# Patient Record
Sex: Male | Born: 1955 | Race: White | Hispanic: Yes | Marital: Married | Smoking: Former smoker
Health system: Southern US, Community
[De-identification: ages and names within clinical notes are randomized; demographics above are authoritative.]

## PROBLEM LIST (undated history)

## (undated) DIAGNOSIS — M65949 Unspecified synovitis and tenosynovitis, unspecified hand: Secondary | ICD-10-CM

## (undated) DIAGNOSIS — M179 Osteoarthritis of knee, unspecified: Secondary | ICD-10-CM

## (undated) DIAGNOSIS — M659 Synovitis and tenosynovitis, unspecified: Secondary | ICD-10-CM

## (undated) DIAGNOSIS — M25819 Other specified joint disorders, unspecified shoulder: Secondary | ICD-10-CM

## (undated) DIAGNOSIS — M25569 Pain in unspecified knee: Secondary | ICD-10-CM

## (undated) DIAGNOSIS — M7540 Impingement syndrome of unspecified shoulder: Secondary | ICD-10-CM

## (undated) DIAGNOSIS — E785 Hyperlipidemia, unspecified: Secondary | ICD-10-CM

## (undated) DIAGNOSIS — L80 Vitiligo: Secondary | ICD-10-CM

## (undated) DIAGNOSIS — M1710 Unilateral primary osteoarthritis, unspecified knee: Secondary | ICD-10-CM

## (undated) DIAGNOSIS — IMO0001 Reserved for inherently not codable concepts without codable children: Secondary | ICD-10-CM

## (undated) HISTORY — DX: Other specified joint disorders, unspecified shoulder: M25.819

## (undated) HISTORY — DX: Pain in unspecified knee: M25.569

## (undated) HISTORY — DX: Synovitis and tenosynovitis, unspecified: M65.9

## (undated) HISTORY — DX: Unilateral primary osteoarthritis, unspecified knee: M17.10

## (undated) HISTORY — DX: Osteoarthritis of knee, unspecified: M17.9

## (undated) HISTORY — DX: Hyperlipidemia, unspecified: E78.5

## (undated) HISTORY — DX: Vitiligo: L80

## (undated) HISTORY — DX: Impingement syndrome of unspecified shoulder: M75.40

## (undated) HISTORY — DX: Reserved for inherently not codable concepts without codable children: IMO0001

## (undated) HISTORY — DX: Unspecified synovitis and tenosynovitis, unspecified hand: M65.949

## (undated) HISTORY — PX: FINGER SURGERY: SHX640

---

## 2007-05-17 ENCOUNTER — Ambulatory Visit: Payer: Self-pay | Admitting: Internal Medicine

## 2007-05-17 LAB — CONVERTED CEMR LAB
ALT: 41 units/L (ref 0–53)
AST: 22 units/L (ref 0–37)
Basophils Absolute: 0 10*3/uL (ref 0.0–0.1)
Basophils Relative: 0 % (ref 0–1)
Bilirubin Urine: NEGATIVE
Calcium: 9.7 mg/dL (ref 8.4–10.5)
Creatinine, Ser: 0.9 mg/dL (ref 0.40–1.50)
Eosinophils Relative: 5 % (ref 0–5)
HCT: 50.6 % (ref 39.0–52.0)
Hemoglobin: 17.4 g/dL — ABNORMAL HIGH (ref 13.0–17.0)
Hgb A1c MFr Bld: 10.5 % — ABNORMAL HIGH (ref 4.6–6.1)
MCHC: 34.4 g/dL (ref 30.0–36.0)
Monocytes Absolute: 0.6 10*3/uL (ref 0.1–1.0)
Neutro Abs: 5.1 10*3/uL (ref 1.7–7.7)
Nitrite: NEGATIVE
Platelets: 169 10*3/uL (ref 150–400)
Protein, U semiquant: NEGATIVE
RDW: 13.1 % (ref 11.5–15.5)
Sodium: 137 meq/L (ref 135–145)
Specific Gravity, Urine: 1.01
Urobilinogen, UA: NEGATIVE
pH: 5

## 2007-05-23 ENCOUNTER — Ambulatory Visit: Payer: Self-pay | Admitting: Internal Medicine

## 2007-05-23 LAB — CONVERTED CEMR LAB: Glucose, Bld: 215 mg/dL

## 2007-06-05 ENCOUNTER — Telehealth: Payer: Self-pay | Admitting: Internal Medicine

## 2007-06-23 ENCOUNTER — Ambulatory Visit: Payer: Self-pay | Admitting: Internal Medicine

## 2007-08-09 ENCOUNTER — Ambulatory Visit: Payer: Self-pay | Admitting: Internal Medicine

## 2007-08-14 LAB — CONVERTED CEMR LAB
GFR calc Af Amer: 154 mL/min
GFR calc non Af Amer: 127 mL/min
Glucose, Bld: 103 mg/dL — ABNORMAL HIGH (ref 70–99)
Hgb A1c MFr Bld: 6.7 % — ABNORMAL HIGH (ref 4.6–6.0)
Potassium: 4 meq/L (ref 3.5–5.1)
Sodium: 141 meq/L (ref 135–145)

## 2007-09-12 ENCOUNTER — Observation Stay (HOSPITAL_COMMUNITY): Admission: EM | Admit: 2007-09-12 | Discharge: 2007-09-13 | Payer: Self-pay | Admitting: Emergency Medicine

## 2007-09-12 ENCOUNTER — Ambulatory Visit: Payer: Self-pay | Admitting: Internal Medicine

## 2007-09-12 ENCOUNTER — Telehealth (INDEPENDENT_AMBULATORY_CARE_PROVIDER_SITE_OTHER): Payer: Self-pay | Admitting: *Deleted

## 2007-09-12 LAB — CONVERTED CEMR LAB: Hemoglobin: 16.9 g/dL

## 2007-09-22 DIAGNOSIS — IMO0001 Reserved for inherently not codable concepts without codable children: Secondary | ICD-10-CM

## 2007-09-22 HISTORY — DX: Reserved for inherently not codable concepts without codable children: IMO0001

## 2007-09-25 ENCOUNTER — Encounter: Payer: Self-pay | Admitting: Internal Medicine

## 2007-09-25 ENCOUNTER — Ambulatory Visit: Payer: Self-pay

## 2007-09-26 ENCOUNTER — Ambulatory Visit: Payer: Self-pay | Admitting: Internal Medicine

## 2007-11-07 ENCOUNTER — Encounter (INDEPENDENT_AMBULATORY_CARE_PROVIDER_SITE_OTHER): Payer: Self-pay | Admitting: *Deleted

## 2007-11-29 ENCOUNTER — Ambulatory Visit: Payer: Self-pay | Admitting: Internal Medicine

## 2007-11-29 DIAGNOSIS — L989 Disorder of the skin and subcutaneous tissue, unspecified: Secondary | ICD-10-CM | POA: Insufficient documentation

## 2007-11-30 ENCOUNTER — Telehealth (INDEPENDENT_AMBULATORY_CARE_PROVIDER_SITE_OTHER): Payer: Self-pay | Admitting: *Deleted

## 2007-12-04 ENCOUNTER — Encounter: Payer: Self-pay | Admitting: Internal Medicine

## 2007-12-04 LAB — CONVERTED CEMR LAB
AST: 32 units/L (ref 0–37)
Cholesterol: 177 mg/dL (ref 0–200)
HDL: 39.3 mg/dL (ref >39.0)
Triglycerides: 76 mg/dL (ref 0–149)
VLDL: 15 mg/dL (ref 0–40)

## 2008-01-23 ENCOUNTER — Ambulatory Visit: Payer: Self-pay | Admitting: Internal Medicine

## 2008-01-23 DIAGNOSIS — F17200 Nicotine dependence, unspecified, uncomplicated: Secondary | ICD-10-CM | POA: Insufficient documentation

## 2008-01-23 DIAGNOSIS — E7800 Pure hypercholesterolemia, unspecified: Secondary | ICD-10-CM | POA: Insufficient documentation

## 2008-01-23 DIAGNOSIS — M25569 Pain in unspecified knee: Secondary | ICD-10-CM | POA: Insufficient documentation

## 2008-01-23 DIAGNOSIS — E785 Hyperlipidemia, unspecified: Secondary | ICD-10-CM

## 2008-01-25 ENCOUNTER — Encounter (INDEPENDENT_AMBULATORY_CARE_PROVIDER_SITE_OTHER): Payer: Self-pay | Admitting: *Deleted

## 2008-02-07 ENCOUNTER — Encounter: Payer: Self-pay | Admitting: Internal Medicine

## 2008-03-05 ENCOUNTER — Telehealth (INDEPENDENT_AMBULATORY_CARE_PROVIDER_SITE_OTHER): Payer: Self-pay | Admitting: *Deleted

## 2008-03-08 ENCOUNTER — Ambulatory Visit: Payer: Self-pay | Admitting: Internal Medicine

## 2008-04-01 ENCOUNTER — Ambulatory Visit: Payer: Self-pay | Admitting: Internal Medicine

## 2008-04-01 DIAGNOSIS — M4800 Spinal stenosis, site unspecified: Secondary | ICD-10-CM

## 2008-04-03 ENCOUNTER — Telehealth (INDEPENDENT_AMBULATORY_CARE_PROVIDER_SITE_OTHER): Payer: Self-pay | Admitting: *Deleted

## 2008-04-03 LAB — CONVERTED CEMR LAB
AST: 34 units/L (ref 0–37)
Albumin: 4.2 g/dL (ref 3.5–5.2)
Alkaline Phosphatase: 82 units/L (ref 39–117)
BUN: 15 mg/dL (ref 6–23)
CO2: 27 meq/L (ref 19–32)
Chloride: 107 meq/L (ref 96–112)
GFR calc non Af Amer: 109 mL/min
Hemoglobin: 16.1 g/dL (ref 13.0–17.0)
Hgb A1c MFr Bld: 6.7 % — ABNORMAL HIGH (ref 4.6–6.0)
Potassium: 4.2 meq/L (ref 3.5–5.1)

## 2008-04-09 ENCOUNTER — Encounter: Payer: Self-pay | Admitting: Internal Medicine

## 2008-04-10 ENCOUNTER — Encounter: Payer: Self-pay | Admitting: Internal Medicine

## 2008-04-26 ENCOUNTER — Ambulatory Visit: Payer: Self-pay | Admitting: Gastroenterology

## 2008-05-28 ENCOUNTER — Telehealth (INDEPENDENT_AMBULATORY_CARE_PROVIDER_SITE_OTHER): Payer: Self-pay | Admitting: *Deleted

## 2008-06-13 ENCOUNTER — Encounter (INDEPENDENT_AMBULATORY_CARE_PROVIDER_SITE_OTHER): Payer: Self-pay | Admitting: *Deleted

## 2008-07-09 ENCOUNTER — Ambulatory Visit: Payer: Self-pay | Admitting: Gastroenterology

## 2008-07-09 LAB — CONVERTED CEMR LAB
Basophils Relative: 0.7 % (ref 0.0–3.0)
Eosinophils Relative: 2.1 % (ref 0.0–5.0)
Ferritin: 192.5 ng/mL (ref 22.0–322.0)
Folate: 14.1 ng/mL
Lymphocytes Relative: 29.3 % (ref 12.0–46.0)
MCV: 87.1 fL (ref 78.0–100.0)
Neutrophils Relative %: 59.7 % (ref 43.0–77.0)
RBC: 5.29 M/uL (ref 4.22–5.81)
Saturation Ratios: 21.2 % (ref 20.0–50.0)
Vitamin B-12: 471 pg/mL (ref 211–911)
WBC: 6.5 10*3/uL (ref 4.5–10.5)

## 2008-07-11 ENCOUNTER — Ambulatory Visit: Payer: Self-pay | Admitting: Internal Medicine

## 2008-07-18 ENCOUNTER — Telehealth (INDEPENDENT_AMBULATORY_CARE_PROVIDER_SITE_OTHER): Payer: Self-pay | Admitting: *Deleted

## 2008-07-22 ENCOUNTER — Ambulatory Visit: Payer: Self-pay | Admitting: Gastroenterology

## 2008-09-25 ENCOUNTER — Ambulatory Visit: Payer: Self-pay | Admitting: Internal Medicine

## 2008-09-30 LAB — CONVERTED CEMR LAB
AST: 30 units/L (ref 0–37)
CO2: 27 meq/L (ref 19–32)
Calcium: 9.2 mg/dL (ref 8.4–10.5)
Creatinine, Ser: 0.7 mg/dL (ref 0.4–1.5)
Glucose, Bld: 132 mg/dL — ABNORMAL HIGH (ref 70–99)
Hgb A1c MFr Bld: 6.8 % — ABNORMAL HIGH (ref 4.6–6.5)
LDL Cholesterol: 65 mg/dL (ref 0–99)
PSA: 0.37 ng/mL (ref 0.10–4.00)
Total CHOL/HDL Ratio: 3
Triglycerides: 109 mg/dL (ref 0.0–149.0)

## 2009-01-29 ENCOUNTER — Telehealth (INDEPENDENT_AMBULATORY_CARE_PROVIDER_SITE_OTHER): Payer: Self-pay | Admitting: *Deleted

## 2009-01-30 ENCOUNTER — Telehealth (INDEPENDENT_AMBULATORY_CARE_PROVIDER_SITE_OTHER): Payer: Self-pay | Admitting: *Deleted

## 2009-02-03 ENCOUNTER — Ambulatory Visit: Payer: Self-pay | Admitting: Internal Medicine

## 2009-02-07 ENCOUNTER — Telehealth (INDEPENDENT_AMBULATORY_CARE_PROVIDER_SITE_OTHER): Payer: Self-pay | Admitting: *Deleted

## 2009-02-12 ENCOUNTER — Encounter (INDEPENDENT_AMBULATORY_CARE_PROVIDER_SITE_OTHER): Payer: Self-pay | Admitting: *Deleted

## 2009-03-26 ENCOUNTER — Telehealth (INDEPENDENT_AMBULATORY_CARE_PROVIDER_SITE_OTHER): Payer: Self-pay | Admitting: *Deleted

## 2009-06-04 ENCOUNTER — Ambulatory Visit: Payer: Self-pay | Admitting: Internal Medicine

## 2009-06-11 ENCOUNTER — Ambulatory Visit: Payer: Self-pay | Admitting: Internal Medicine

## 2009-06-19 ENCOUNTER — Telehealth (INDEPENDENT_AMBULATORY_CARE_PROVIDER_SITE_OTHER): Payer: Self-pay | Admitting: *Deleted

## 2009-06-19 LAB — CONVERTED CEMR LAB
BUN: 15 mg/dL (ref 6–23)
Chloride: 107 meq/L (ref 96–112)
Creatinine, Ser: 0.7 mg/dL (ref 0.4–1.5)
Glucose, Bld: 183 mg/dL — ABNORMAL HIGH (ref 70–99)
Hemoglobin: 14.6 g/dL (ref 13.0–17.0)
Hgb A1c MFr Bld: 8 % — ABNORMAL HIGH (ref 4.6–6.5)

## 2009-08-22 ENCOUNTER — Telehealth (INDEPENDENT_AMBULATORY_CARE_PROVIDER_SITE_OTHER): Payer: Self-pay | Admitting: *Deleted

## 2009-09-02 ENCOUNTER — Ambulatory Visit: Payer: Self-pay | Admitting: Internal Medicine

## 2009-10-31 ENCOUNTER — Encounter: Payer: Self-pay | Admitting: Internal Medicine

## 2009-12-10 ENCOUNTER — Ambulatory Visit: Payer: Self-pay | Admitting: Internal Medicine

## 2009-12-10 DIAGNOSIS — M79609 Pain in unspecified limb: Secondary | ICD-10-CM

## 2009-12-10 LAB — HM DIABETES FOOT EXAM

## 2009-12-11 LAB — CONVERTED CEMR LAB
ALT: 27 units/L (ref 0–53)
AST: 22 units/L (ref 0–37)
BUN: 18 mg/dL (ref 6–23)
Basophils Relative: 0.6 % (ref 0.0–3.0)
Chloride: 105 meq/L (ref 96–112)
Eosinophils Relative: 5 % (ref 0.0–5.0)
HCT: 42.4 % (ref 39.0–52.0)
Hemoglobin: 14.3 g/dL (ref 13.0–17.0)
Hgb A1c MFr Bld: 9 % — ABNORMAL HIGH (ref 4.6–6.5)
LDL Cholesterol: 81 mg/dL (ref 0–99)
Lymphs Abs: 2.1 10*3/uL (ref 0.7–4.0)
MCV: 86 fL (ref 78.0–100.0)
Microalb, Ur: 1.5 mg/dL (ref 0.0–1.9)
Monocytes Absolute: 0.5 10*3/uL (ref 0.1–1.0)
Neutro Abs: 4.2 10*3/uL (ref 1.4–7.7)
PSA: 0.47 ng/mL (ref 0.10–4.00)
Platelets: 170 10*3/uL (ref 150.0–400.0)
Potassium: 3.7 meq/L (ref 3.5–5.1)
RBC: 4.93 M/uL (ref 4.22–5.81)
Sodium: 139 meq/L (ref 135–145)
Total CHOL/HDL Ratio: 3
Total Protein: 6.5 g/dL (ref 6.0–8.3)
VLDL: 10.4 mg/dL (ref 0.0–40.0)
WBC: 7.3 10*3/uL (ref 4.5–10.5)

## 2009-12-23 ENCOUNTER — Encounter: Payer: Self-pay | Admitting: Internal Medicine

## 2010-01-05 ENCOUNTER — Telehealth: Payer: Self-pay | Admitting: Internal Medicine

## 2010-04-20 ENCOUNTER — Ambulatory Visit: Payer: Self-pay | Admitting: Internal Medicine

## 2010-04-24 LAB — CONVERTED CEMR LAB
ALT: 24 units/L (ref 0–53)
Hgb A1c MFr Bld: 7.5 % — ABNORMAL HIGH (ref 4.6–6.5)

## 2010-06-23 NOTE — Progress Notes (Signed)
Summary: diabetes poorly controlled  Phone Note Outgoing Call   Summary of Call: spoke with patient, last hemoglobin A1c was 9. At the time, he was not taking Actos routinely , he was afraid of side effects. At this point, he is taking the medication as prescribed, amb. CBGs have decreased, they are usually 110. He is having some edema at the end of the day but is not severe. We agreed to: Continue present care and take his medication daily He will come back in October for a checkup Jose E. Paz MD  January 05, 2010 8:39 AM

## 2010-06-23 NOTE — Progress Notes (Signed)
  Phone Note Refill Request Message from:  Patient  Refills Requested: Medication #1:  METFORMIN HCL 1000 MG TABS two times a day send to [facility] [phone]  Initial call taken by: Kandice Hams,  August 22, 2009 2:52 PM  Follow-up for Phone Call        left msg for pt rx has been faxed .Kandice Hams  August 22, 2009 2:58 PM  Follow-up by: Kandice Hams,  August 22, 2009 2:58 PM    Prescriptions: METFORMIN HCL 1000 MG TABS (METFORMIN HCL) two times a day  #60 x 1   Entered by:   Kandice Hams   Authorized by:   Nolon Rod. Paz MD   Signed by:   Kandice Hams on 08/22/2009   Method used:   Faxed to ...       [facility] [address] # [phone]* (retail)       [address] [place], [place]  [address]       Ph: [phone]       Fax: [phone]   RxID:   [id number]

## 2010-06-23 NOTE — Assessment & Plan Note (Signed)
Summary: 3 MTH FU/NS/KDC   Vital Signs:  Patient profile:   55 year old male Height:      73.25 inches Weight:      223 pounds Pulse rate:   60 / minute BP sitting:   124 / 76  Vitals Entered By: Shary Decamp (September 02, 2009 3:17 PM) CC: rov, blood sugar @ home avg 120's   History of Present Illness: routine office visit, feels well  Current Medications (verified): 1)  Onetouch Ultra Test  Strp (Glucose Blood) .... Test Blood Sugars Four Times A Day Dx 250.00 2)  Onetouch Lancets  Misc (Lancets) .... Test Blood Sugars 4 Times A Day Dx 250.00 3)  Metformin Hcl 1000 Mg Tabs (Metformin Hcl) .... Two Times A Day 4)  Pravachol 40 Mg Tabs (Pravastatin Sodium) .Marland Kitchen.. 1 By Mouth At Bedtime 5)  Actos 45 Mg Tabs (Pioglitazone Hcl) .Marland Kitchen.. 1 By Mouth Once Daily  Allergies (verified): No Known Drug Allergies  Past History:  Past Medical History: Reviewed history from 06/04/2009 and no changes required. vitiligo Dx in the 90s Diabetes mellitus, type II  Dx 05-17-07 Hyperlipidemia Knee pain, w/u included a MRI of the knee (-) and back, Dx w/ Spinal Stenosis (believed to be causing knee pain) (-) stress test 09-2007 Cscope for hemathochezia 07-2008 no polyps, (+) hemorrhoids, next 10 years   Past Surgical History: Reviewed history from 05/17/2007 and no changes required. finger surgery  Social History: Married 2 chikdren from [place] works at [employer], night shift  Diet-- his diet has improved lately, wife is [place] area exercise-- little d/t leg pain  tobacco--quit tobacco a few weeks ago ETOH-- rarely   Review of Systems       after the last hemoglobin A1c he was Rx  Actos however the patient did not . he knew he was not taking metformin regularly before a day A1c and he decided to take metformin as prescribed he also has noted weight loss lately but denies any fever, cough, nausea vomiting or diarrhea his diet has changed, slightly better. In the last OV  he complained of  fatigue, that has resolved Some anxiety mostly related related to his mother's health  Physical Exam  General:  alert, well-developed, and well-nourished.   Lungs:  normal respiratory effort, no intercostal retractions, no accessory muscle use, and normal breath sounds.   Heart:  normal rate, regular rhythm, and no murmur.   Extremities:  no edema   Impression & Recommendations:  Problem # 1:  DIABETES MELLITUS, TYPE II (ICD-250.00) patient decided not to take Actos as Rx , rather he started to take metformin routinely his CBGs started to drop from the 180s to the  120s he has also noted weight loss, diet apparently slightly better labs The following medications were removed from the medication list:    Actos 45 Mg Tabs (Pioglitazone hcl) .Marland Kitchen... 1 by mouth once daily His updated medication list for this problem includes:    Metformin Hcl 1000 Mg Tabs (Metformin hcl) .Marland Kitchen..Marland Kitchen Two times a day  Orders: Venipuncture (40981) TLB-A1C / Hgb A1C (Glycohemoglobin) (83036-A1C)  Complete Medication List: 1)  Onetouch Ultra Test Strp (Glucose blood) .... Test blood sugars four times a day dx 250.00 2)  Onetouch Lancets Misc (Lancets) .... Test blood sugars 4 times a day dx 250.00 3)  Metformin Hcl 1000 Mg Tabs (Metformin hcl) .... Two times a day 4)  Pravachol 40 Mg Tabs (Pravastatin sodium) .Marland Kitchen.. 1 by mouth at bedtime  Patient  Instructions: 1)  Please schedule a follow-up appointment in 3 months (fasting, physical exam) Prescriptions: PRAVACHOL 40 MG TABS (PRAVASTATIN SODIUM) 1 by mouth at bedtime  #90 x 3   Entered and Authorized by:   Nolon Rod. Charnee Turnipseed MD   Signed by:   Nolon Rod. Dayven Linsley MD on 09/02/2009   Method used:   Print then Give to Patient   RxID:   [id number] METFORMIN HCL 1000 MG TABS (METFORMIN HCL) two times a day  #180 x 3   Entered and Authorized by:   Nolon Rod. Tamilyn Lupien MD   Signed by:   Nolon Rod. Bernese Doffing MD on 09/02/2009   Method used:   Print then Give to Patient   RxID:    [id number]

## 2010-06-23 NOTE — Assessment & Plan Note (Signed)
Summary: out of metformin,also followup visit cancelled from before/kdc   Vital Signs:  Patient profile:   55 year old male Height:      73.25 inches Weight:      229.8 pounds BMI:     30.22 Pulse rate:   68 / minute BP sitting:   120 / 80  Vitals Entered By: Shary Decamp (June 04, 2009 3:59 PM) CC: rov - out of meds x 5 days   History of Present Illness: DM base on the last hemoglobin A1c, his metformin was increased.  Good compliance with medication except for the last 5 days when he ran out of metformin His CBGs are checked infrequently, yesterday it was 150 he is due for an eye exam on  06-2009  high chol--  good medication compliance  fatigue   for the last two months he has noted  lack of energy admits to mild  depressive mood , no anxiety.  Some financial issues can't explain his fatigue any better, see review of systems  Current Medications (verified): 1)  Onetouch Ultra Test  Strp (Glucose Blood) .... Test Blood Sugars Four Times A Day Dx 250.00 2)  Onetouch Lancets  Misc (Lancets) .... Test Blood Sugars 4 Times A Day Dx 250.00 3)  Metformin Hcl 1000 Mg Tabs (Metformin Hcl) .... Two Times A Day 4)  Pravachol 40 Mg Tabs (Pravastatin Sodium) .Marland Kitchen.. 1 By Mouth At Bedtime  Allergies (verified): No Known Drug Allergies  Past History:  Past Medical History: vitiligo Dx in the 90s Diabetes mellitus, type II  Dx 05-17-07 Hyperlipidemia Knee pain, w/u included a MRI of the knee (-) and back, Dx w/ Spinal Stenosis (believed to be causing knee pain) (-) stress test 09-2007 Cscope for hemathochezia 07-2008 no polyps, (+) hemorrhoids, next 10 years   Past Surgical History: Reviewed history from 05/17/2007 and no changes required. finger surgery  Social History: Reviewed history from 02/03/2009 and no changes required. Married 2 chikdren from [place] works at [employer], night shift  Diet-- trying to eat healthy exercise-- little d/t leg pain  tobacco-- casual  smoker  ETOH-- rarely   Review of Systems       denies fevers no chest pain, shortness or breath or orthopnea denies nausea vomiting or diarrhea his diet continued to be okay denies any lower extremity paresthesias sleep sometimes interrupted as he continued to have right knee discomfort.  Physical Exam  General:  alert, well-developed, and well-nourished.   Lungs:  normal respiratory effort, no intercostal retractions, no accessory muscle use, and normal breath sounds.   Heart:  normal rate, regular rhythm, no murmur, and no gallop.   Abdomen:  soft, non-tender, no distention, no masses, and no hepatomegaly.   Extremities:  no edema Psych:  not anxious appearing and not depressed appearing.     Impression & Recommendations:  Problem # 1:  HYPERLIPIDEMIA (ICD-272.4) at goal per last labs His updated medication list for this problem includes:    Pravachol 40 Mg Tabs (Pravastatin sodium) .Marland Kitchen... 1 by mouth at bedtime  Labs Reviewed: SGOT: 30 (09/25/2008)   SGPT: 31 (09/25/2008)   HDL:36.10 (09/25/2008), 39.3 (11/29/2007)  LDL:65 (09/25/2008), 123 (16/02/9603)  Chol:123 (09/25/2008), 177 (11/29/2007)  Trig:109.0 (09/25/2008), 76 (11/29/2007)  Problem # 2:  DIABETES MELLITUS, TYPE II (ICD-250.00) good compliance with medication except for the last few days RF labs adjust medication if needed His updated medication list for this problem includes:    Metformin Hcl 1000 Mg Tabs (Metformin hcl) .Marland KitchenMarland KitchenMarland KitchenMarland Kitchen  Two times a day  Labs Reviewed: Creat: 0.7 (09/25/2008)    Reviewed HgBA1c results: 7.9 (02/03/2009)  6.8 (09/25/2008)  Problem # 3:  FATIGUE (ICD-780.79) Assessment: New lack of energy for two months, mild depression? Labs observe further evaluation if symptoms progress  Complete Medication List: 1)  Onetouch Ultra Test Strp (Glucose blood) .... Test blood sugars four times a day dx 250.00 2)  Onetouch Lancets Misc (Lancets) .... Test blood sugars 4 times a day dx 250.00 3)   Metformin Hcl 1000 Mg Tabs (Metformin hcl) .... Two times a day 4)  Pravachol 40 Mg Tabs (Pravastatin sodium) .Marland Kitchen.. 1 by mouth at bedtime  Patient Instructions: 1)  please come back, nonfasting, for the following labs: 2)   A1c, BMP --- DX diabetes 3)  AST, ALT----Dx high cholesterol 4)  TSH, hemoglobin--- Dx  fatigue 5)  Please schedule a follow-up appointment in 3 months .  Prescriptions: PRAVACHOL 40 MG TABS (PRAVASTATIN SODIUM) 1 by mouth at bedtime  #90 x 1   Entered and Authorized by:   Elita Quick E. Melenie Minniear MD   Signed by:   Nolon Rod. Numair Masden MD on 06/04/2009   Method used:   Electronically to        [facility] # [phone]* (retail)       [address] [place], [place]  [address]       Ph: [phone]       Fax: [phone]   RxID:   [id number] METFORMIN HCL 1000 MG TABS (METFORMIN HCL) two times a day  #180 x 1   Entered and Authorized by:   Nolon Rod. Sussie Minor MD   Signed by:   Nolon Rod. Venezia Sargeant MD on 06/04/2009   Method used:   Electronically to        [facility] # [phone]* (retail)       [address] [place], [place]  [address]       Ph: [phone]       Fax: [phone]   RxID:   [id number] PRAVACHOL 40 MG TABS (PRAVASTATIN SODIUM) 1 by mouth at bedtime  #30 x 6   Entered by:   Shary Decamp   Authorized by:   Nolon Rod. Nirav Sweda MD   Signed by:   Shary Decamp on 06/04/2009   Method used:   Electronically to        [facility] # [phone]* (retail)       [address] [place], [place]  [address]       Ph: [phone]       Fax: [phone]   RxID:   [id number] METFORMIN HCL 1000 MG TABS (METFORMIN HCL) two times a day  #60 x 6   Entered by:   Shary Decamp   Authorized by:   Nolon Rod. Jensen Cheramie MD   Signed by:   Shary Decamp on 06/04/2009   Method used:   Electronically to        [facility] # [phone]* (retail)       [address]       [address]       [place], [place]   [address]  Ph: [phone]       Fax: [phone]   RxID:   [id number]

## 2010-06-23 NOTE — Letter (Signed)
Summary: negative diabetic eye exam  [facility]   Imported By: Lanelle Bal 01/01/2010 11:21:15  _____________________________________________________________________  External Attachment:    Type:   Image     Comment:   External Document

## 2010-06-23 NOTE — Progress Notes (Signed)
Summary: actos rx  Phone Note Outgoing Call   Summary of Call: dm  not well controlled, add Actos 45 mg daily--call #30 and 3 RF, samples ok LMOM f/u as planned Jose E. Paz MD  June 19, 2009 12:01 PM      New/Updated Medications: ACTOS 45 MG TABS (PIOGLITAZONE HCL) 1 by mouth once daily Prescriptions: ACTOS 45 MG TABS (PIOGLITAZONE HCL) 1 by mouth once daily  #3 x 3   Entered by:   Shary Decamp   Authorized by:   Nolon Rod. Paz MD   Signed by:   Shary Decamp on 06/19/2009   Method used:   Electronically to        [facility] # [phone]* (retail)       [address] [place], [place]  [address]       Ph: [phone]       Fax: [phone]   RxID:   [id number]   Appended Document: actos rx LMOM again , checking to be sure he got the previous message

## 2010-06-23 NOTE — Letter (Signed)
Summary: Letter Regarding Lipid Panel & Microalbumin Screening/[employer]  Letter Regarding Lipid Panel & Microalbumin Screening/[employer]   Imported By: Lanelle Bal 11/11/2009 09:17:35  _____________________________________________________________________  External Attachment:    Type:   Image     Comment:   External Document

## 2010-06-23 NOTE — Assessment & Plan Note (Signed)
Summary: rto 4 months/cbs   Vital Signs:  Patient profile:   55 year old male Height:      74 inches Weight:      231.25 pounds BMI:     29.80 Pulse rate:   64 / minute Pulse rhythm:   regular BP sitting:   126 / 82  (left arm) Cuff size:   large  Vitals Entered By: Army Fossa CMA (April 20, 2010 3:44 PM) CC: 4 month f/u- fasting  Comments flu shot  [facility] [address]    History of Present Illness: routine office visit Good compliance with Actos ----> ambulatory CBGs between 99 and 130 the last time he complained of numbness in the toes, that  self resolve without even taking Neurontin continued to have severe, bilateral, arm pain. Pain is sharp, short duration, triggered by certain positions.  Review of systems denies chest pain, shortness of breath or edema    Current Medications (verified): 1)  Onetouch Ultra Test  Strp (Glucose Blood) .... Test Blood Sugars Four Times A Day Dx 250.00 2)  Onetouch Lancets  Misc (Lancets) .... Test Blood Sugars 4 Times A Day Dx 250.00 3)  Metformin Hcl 1000 Mg Tabs (Metformin Hcl) .... Two Times A Day 4)  Pravachol 40 Mg Tabs (Pravastatin Sodium) .Marland Kitchen.. 1 By Mouth At Bedtime 5)  Actos 30 Mg Tabs (Pioglitazone Hcl) .Marland Kitchen.. 1 By Mouth Once Daily 6)  Gabapentin 300 Mg Caps (Gabapentin) .Marland Kitchen.. 1 At Bedtime  Allergies (verified): No Known Drug Allergies  Past History:  Past Medical History: Reviewed history from 06/04/2009 and no changes required. vitiligo Dx in the 90s Diabetes mellitus, type II  Dx 05-17-07 Hyperlipidemia Knee pain, w/u included a MRI of the knee (-) and back, Dx w/ Spinal Stenosis (believed to be causing knee pain) (-) stress test 09-2007 Cscope for hemathochezia 07-2008 no polyps, (+) hemorrhoids, next 10 years   Past Surgical History: Reviewed history from 05/17/2007 and no changes required. finger surgery  Physical Exam  General:  alert, well-developed, and well-nourished.   Lungs:  normal respiratory  effort, no intercostal retractions, no accessory muscle use, and normal breath sounds.   Heart:  normal rate, regular rhythm, and no murmur.   Msk:  nontender to palpation at the bicipital,tricipital or quadriceps area Extremities:  no lower extremity edema   Impression & Recommendations:  Problem # 1:  ARM PAIN (ICD-729.5) related to cholesterol medication? see Instructions Orders: TLB-CK Total Only(Creatine Kinase/CPK) (82550-CK) Specimen Handling ([address])  Problem # 2:  HYPERLIPIDEMIA (ICD-272.4) Will hold Pravachol for a month. See #1 His updated medication list for this problem includes:    Pravachol 40 Mg Tabs (Pravastatin sodium) .Marland Kitchen... 1 by mouth at bedtime  Labs Reviewed: SGOT: 22 (12/10/2009)   SGPT: 27 (12/10/2009)   HDL:48.60 (12/10/2009), 36.10 (09/25/2008)  LDL:81 (12/10/2009), 65 (09/25/2008)  Chol:140 (12/10/2009), 123 (09/25/2008)  Trig:52.0 (12/10/2009), 109.0 (09/25/2008)  Problem # 3:  DIABETES MELLITUS, TYPE II (ICD-250.00) good compliance with medication ;  labs he complained of toe burning at the last office visit, symptoms self resolved without medication His updated medication list for this problem includes:    Metformin Hcl 1000 Mg Tabs (Metformin hcl) .Marland Kitchen..Marland Kitchen Two times a day    Actos 30 Mg Tabs (Pioglitazone hcl) .Marland Kitchen... 1 by mouth once daily  Orders: Venipuncture ([address]) TLB-A1C / Hgb A1C (Glycohemoglobin) (83036-A1C) TLB-ALT (SGPT) (84460-ALT) TLB-AST (SGOT) (84450-SGOT) Specimen Handling (09811)  Labs Reviewed: Creat: 0.7 (12/10/2009)    Reviewed HgBA1c results: 9.0 (12/10/2009)  7.4 (09/02/2009)  Complete Medication List: 1)  Onetouch Ultra Test Strp (Glucose blood) .... Test blood sugars four times a day dx 250.00 2)  Onetouch Lancets Misc (Lancets) .... Test blood sugars 4 times a day dx 250.00 3)  Metformin Hcl 1000 Mg Tabs (Metformin hcl) .... Two times a day 4)  Pravachol 40 Mg Tabs (Pravastatin sodium) .Marland Kitchen.. 1 by mouth at bedtime 5)  Actos  30 Mg Tabs (Pioglitazone hcl) .Marland Kitchen.. 1 by mouth once daily  Other Orders: Admin 1st Vaccine ([address]) Flu Vaccine 34yrs + ([address])  Patient Instructions: 1)  NO pravastatin x 1 month, then restart 2)  call and let me know if the pain is or is not related to pravastatin 3)  Please schedule a follow-up appointment in 3 months .  Flu Vaccine Consent Questions     Do you have a history of severe allergic reactions to this vaccine? no    Any prior history of allergic reactions to egg and/or gelatin? no    Do you have a sensitivity to the preservative Thimersol? no    Do you have a past history of Guillan-Barre Syndrome? no    Do you currently have an acute febrile illness? no    Have you ever had a severe reaction to latex? no    Vaccine information given and explained to patient? yes    Are you currently pregnant? no    Lot Number:[id number]   Exp Date:11/21/2010   Site Given  right Deltoid IM  Orders Added: 1)  Admin 1st Vaccine [90471] 2)  Flu Vaccine 21yrs + [90658] 3)  Venipuncture [36415] 4)  TLB-A1C / Hgb A1C (Glycohemoglobin) [83036-A1C] 5)  TLB-ALT (SGPT) [84460-ALT] 6)  TLB-AST (SGOT) [84450-SGOT] 7)  TLB-CK Total Only(Creatine Kinase/CPK) [82550-CK] 8)  Specimen Handling [99000] 9)  Est. Patient Level III [09811]   .[email or web address]

## 2010-06-23 NOTE — Assessment & Plan Note (Signed)
Summary: cpx   Vital Signs:  Patient profile:   55 year old male Height:      74 inches Weight:      227.13 pounds Pulse rate:   58 / minute Pulse rhythm:   re1 BP sitting:   132 / 80  (left arm) Cuff size:   large  Vitals Entered By: Army Fossa CMA (December 10, 2009 8:34 AM) CC: Pt here for CPX: Fasting.   History of Present Illness: CPX -also complaining of burning at the tip of his toes bilaterally, worse at bedtime -also complained of right more than left arm pain. The pain is stabbing, acute, sharp and triggered  by certain movements. No neck pain  Preventive Screening-Counseling & Management  Caffeine-Diet-Exercise     Does Patient Exercise: no  Current Medications (verified): 1)  Onetouch Ultra Test  Strp (Glucose Blood) .... Test Blood Sugars Four Times A Day Dx 250.00 2)  Onetouch Lancets  Misc (Lancets) .... Test Blood Sugars 4 Times A Day Dx 250.00 3)  Metformin Hcl 1000 Mg Tabs (Metformin Hcl) .... Two Times A Day 4)  Pravachol 40 Mg Tabs (Pravastatin Sodium) .Marland Kitchen.. 1 By Mouth At Bedtime 5)  Actos 30 Mg Tabs (Pioglitazone Hcl) .Marland Kitchen.. 1 By Mouth Once Daily  Allergies (verified): No Known Drug Allergies  Past History:  Past Medical History: Reviewed history from 06/04/2009 and no changes required. vitiligo Dx in the 90s Diabetes mellitus, type II  Dx 05-17-07 Hyperlipidemia Knee pain, w/u included a MRI of the knee (-) and back, Dx w/ Spinal Stenosis (believed to be causing knee pain) (-) stress test 09-2007 Cscope for hemathochezia 07-2008 no polyps, (+) hemorrhoids, next 10 years   Past Surgical History: Reviewed history from 05/17/2007 and no changes required. finger surgery  Family History: Reviewed history from 09/12/2007 and no changes required. CAD - F  in his mid 61s HTN - M STROKE - M DM-F,M COLON CA - NO PROSTATE CA - NO DM-- F and M  Social History: Married 2 chikdren from [place] works at [employer], night shift  Diet-- his diet is ok  , wife is vegetarian exercise-- active at work tobacco-- rarely smokes  ETOH-- rarely Does Patient Exercise:  no  Review of Systems CV:  Denies chest pain or discomfort and swelling of feet. Resp:  Denies cough, shortness of breath, and wheezing. GI:  Denies bloody stools, diarrhea, nausea, and vomiting. GU:  Denies dysuria, hematuria, urinary frequency, and urinary hesitancy.  Physical Exam  General:  alert, well-developed, and well-nourished.   Neck:  supple, full ROM, no masses, no thyromegaly, and normal carotid upstroke.   cervical spine nontender to palpation Lungs:  normal respiratory effort, no intercostal retractions, no accessory muscle use, and normal breath sounds.   Heart:  normal rate, regular rhythm, and no murmur.   Abdomen:  soft, non-tender, no distention, no masses, no guarding, and no rigidity.   Rectal:  No external abnormalities noted. Normal sphincter tone. No rectal masses or tenderness. Prostate:  Prostate gland firm and smooth, no enlargement, nodularity, tenderness, mass, asymmetry or induration. Pulses:  normal pedal pulses bilaterally Extremities:  no lower extremity edema Neurologic:  alert & oriented X3, strength normal in all extremities, and DTRs symmetrical and normal.   Psych:  Cognition and judgment appear intact. Alert and cooperative with normal attention span and concentration.   Diabetes Management Exam:    Foot Exam (with socks and/or shoes not present):       Sensory-Pinprick/Light  touch:          Left medial foot (L-4): normal          Left dorsal foot (L-5): normal          Left lateral foot (S-1): normal          Right medial foot (L-4): normal          Right dorsal foot (L-5): normal          Right lateral foot (S-1): normal       Sensory-Monofilament:          Left foot: diminished          Right foot: diminished       Sensory-other: sensory monofilament decreased at the tip of the third and fourth right toes and fourth left toe        Inspection:          Left foot: normal          Right foot: normal       Nails:          Left foot: normal          Right foot: normal   Impression & Recommendations:  Problem # 1:  HEALTH SCREENING (ICD-V70.0) Td 10-07 Cscope for hemathochezia 07-2008 no polyps, (+) hemorrhoids, next 10 years   he used to smoke tobacco or q. day, now smokes rarely. Encouraged to quit Diet and exercise discussed Labs  Orders: TLB-BMP (Basic Metabolic Panel-BMET) (80048-METABOL) TLB-CBC Platelet - w/Differential (85025-CBCD) TLB-PSA (Prostate Specific Antigen) (84153-PSA) TLB-Hepatic/Liver Function Pnl (80076-HEPATIC) Venipuncture (87564) Specimen Handling (33295)  Problem # 2:  DIABETES MELLITUS, TYPE II (ICD-250.00) note well controlled per last hemoglobin A1c, Actos was added He also has peripheral neuropathy  ( painful and sensory loss, see above)-----------trial w/ gabapentin Feet care discussed encourage eye examination yearly His updated medication list for this problem includes:    Metformin Hcl 1000 Mg Tabs (Metformin hcl) .Marland Kitchen..Marland Kitchen Two times a day    Actos 30 Mg Tabs (Pioglitazone hcl) .Marland Kitchen... 1 by mouth once daily  Labs Reviewed: Creat: 0.7 (06/11/2009)    Reviewed HgBA1c results: 7.4 (09/02/2009)  8.0 (06/11/2009)  Orders: TLB-A1C / Hgb A1C (Glycohemoglobin) (83036-A1C) TLB-Microalbumin/Creat Ratio, Urine (82043-MALB)  Problem # 3:  ARM PAIN (ICD-729.5) see description of symptoms at the history of present illness Neurological exam normal Etiology unclear to see orthopedic surgery if needed  Complete Medication List: 1)  Onetouch Ultra Test Strp (Glucose blood) .... Test blood sugars four times a day dx 250.00 2)  Onetouch Lancets Misc (Lancets) .... Test blood sugars 4 times a day dx 250.00 3)  Metformin Hcl 1000 Mg Tabs (Metformin hcl) .... Two times a day 4)  Pravachol 40 Mg Tabs (Pravastatin sodium) .Marland Kitchen.. 1 by mouth at bedtime 5)  Actos 30 Mg Tabs (Pioglitazone hcl)  .Marland Kitchen.. 1 by mouth once daily 6)  Gabapentin 300 Mg Caps (Gabapentin) .Marland Kitchen.. 1 at bedtime  Other Orders: TLB-Lipid Panel (80061-LIPID)  Patient Instructions: 1)  Please schedule a follow-up appointment in 4 months .  Prescriptions: GABAPENTIN 300 MG CAPS (GABAPENTIN) 1 at bedtime  #30 x 6   Entered and Authorized by:   Nolon Rod. Talin Rozeboom MD   Signed by:   Nolon Rod. Nance Mccombs MD on 12/10/2009   Method used:   Print then Give to Patient   RxID:   [id number]    Risk Factors:  Exercise:  no

## 2010-06-23 NOTE — Progress Notes (Signed)
  Phone Note From Pharmacy   Caller: [facility] # [phone]* Reason for Call: Cannot read prescription Summary of Call: pharmacist called in ref to rx  Actos that was sent in # 3? did you mean 30.  Called and spoke with pharmacist quantity should be 30.  rx REFAXED Initial call taken by: Kandice Hams,  June 19, 2009 3:37 PM    Prescriptions: ACTOS 45 MG TABS (PIOGLITAZONE HCL) 1 by mouth once daily  #30 x 3   Entered by:   Kandice Hams   Authorized by:   Nolon Rod. Paz MD   Signed by:   Kandice Hams on 06/19/2009   Method used:   Re-Faxed to ...       [facility] [address] [address] # [phone]* (retail)       [address] [place], [place]  [address]       Ph: [phone]       Fax: [phone]   RxID:   [id number]

## 2010-08-17 ENCOUNTER — Telehealth: Payer: Self-pay | Admitting: *Deleted

## 2010-08-17 DIAGNOSIS — M25519 Pain in unspecified shoulder: Secondary | ICD-10-CM

## 2010-08-18 NOTE — Telephone Encounter (Signed)
Arrange a ortho referal, dx shoulder pain Be sure he is back on pravachol Also he is due for a OV

## 2010-08-18 NOTE — Telephone Encounter (Signed)
I spoke w/ pts wife she is aware. He will restart Pravachol.

## 2010-09-03 LAB — GLUCOSE, CAPILLARY: Glucose-Capillary: 90 mg/dL (ref 70–99)

## 2010-09-04 ENCOUNTER — Telehealth: Payer: Self-pay | Admitting: Internal Medicine

## 2010-09-04 MED ORDER — GLUCOSE BLOOD VI STRP: ORAL_STRIP | Status: AC

## 2010-09-04 NOTE — Telephone Encounter (Signed)
Pts wife is here requesting a refill for patients test strips. She wants to have this filled now since she says she called yesterday and hasn't heard back.

## 2010-09-18 ENCOUNTER — Ambulatory Visit: Payer: Self-pay | Admitting: Internal Medicine

## 2010-09-22 HISTORY — PX: SHOULDER SURGERY: SHX246

## 2010-09-30 ENCOUNTER — Ambulatory Visit (INDEPENDENT_AMBULATORY_CARE_PROVIDER_SITE_OTHER): Payer: Managed Care, Other (non HMO) | Admitting: Internal Medicine

## 2010-09-30 ENCOUNTER — Encounter: Payer: Self-pay | Admitting: Internal Medicine

## 2010-09-30 DIAGNOSIS — E785 Hyperlipidemia, unspecified: Secondary | ICD-10-CM

## 2010-09-30 DIAGNOSIS — E119 Type 2 diabetes mellitus without complications: Secondary | ICD-10-CM

## 2010-09-30 MED ORDER — METFORMIN HCL 1000 MG PO TABS: ORAL_TABLET | ORAL | Status: DC

## 2010-09-30 NOTE — Progress Notes (Signed)
  Subjective:    Patient ID: Daniel [name], male    DOB: Jan 24, 1956, 55 y.o.   MRN: [id number]  HPI Routine office visit Still having problems with shoulder pain, status post a local injection which helped a little. He had an MRI and has a followup with orthopedic surgery soon.  Past Medical History  Diagnosis Date  . Vitiligo     dx in the 90s  . Diabetes mellitus 05/17/07  . Hyperlipidemia   . Knee pain     w/u included a MRI of the knee (-) and back, dx w/ Spinal Stenosis (believed to be causing knee pain)  . Normal cardiac stress test 09/2007   Past Surgical History  Procedure Date  . Finger surgery      Review of Systems Diet is okay, blood sugar is around 130 in the morning. They were slightly high  immediately after the shoulder injection. Denies any chest pain or shortness of breath. Has his eyes checked at least once a year. No extremity paresthesias    Objective:   Physical Exam Alert, oriented x3. Lungs clear to auscultation bilaterally. Cardiovascular: Regular weight and rhythm without murmur. Extremities no edema. Neurological exam, speech, gait and motor normal        Assessment & Plan:

## 2010-09-30 NOTE — Assessment & Plan Note (Signed)
Last hemoglobin A1c 7.5, encourage diet, works long hours and he is unable to exercise after work.

## 2010-09-30 NOTE — Assessment & Plan Note (Signed)
Well controlled per last labs  

## 2010-10-01 ENCOUNTER — Other Ambulatory Visit (INDEPENDENT_AMBULATORY_CARE_PROVIDER_SITE_OTHER): Payer: Managed Care, Other (non HMO)

## 2010-10-01 DIAGNOSIS — E119 Type 2 diabetes mellitus without complications: Secondary | ICD-10-CM

## 2010-10-02 LAB — BASIC METABOLIC PANEL
CO2: 28 mEq/L (ref 19–32)
Calcium: 9.5 mg/dL (ref 8.4–10.5)
GFR: 112.6 mL/min (ref >60.00)
Glucose, Bld: 288 mg/dL — ABNORMAL HIGH (ref 70–99)
Potassium: 4.4 mEq/L (ref 3.5–5.1)
Sodium: 139 mEq/L (ref 135–145)

## 2010-10-06 NOTE — Discharge Summary (Signed)
NAME:  Daniel [name], Daniel [name] NO.:  [id number]   MEDICAL RECORD NO.:  [id number]          PATIENT TYPE:  INP   LOCATION:  5502                         FACILITY:  [facility]   PHYSICIAN:  Valerie A. Felicity Coyer, MDDATE OF BIRTH:  Oct 07, 1955   DATE OF ADMISSION:  09/12/2007  DATE OF DISCHARGE:  09/13/2007                               DISCHARGE SUMMARY   DISCHARGE DIAGNOSES:  1. Substernal chest pain.  2. Type 2 diabetes.   HISTORY OF PRESENT ILLNESS:  Mr. Finch is a 55 year old Hispanic male  with history of type 2 diabetes who presented to his primary care  physician on day of admission with reports of epigastric pain, on  evening prior to visit.  The patient works night shifts and reports  onset of intense epigastric pain at 4 a.m. after eating doughnut and  drinking water.  The patient also reported subsequent nausea and unsure  of any shortness of the breath.  Described pain as sharp, denied any  radiation or vomiting.  Due to the patient's risk factors including  diabetes, occasional tobacco use, and family history of CAD in his  father, the patient was admitted to the hospital at that time for  further evaluation.   PAST MEDICAL HISTORY:  1. Vitiligo.  2. Type 2 diabetes.  3. History of finger surgery.   COURSE OF HOSPITALIZATION:  1. Substernal chest pain.  The patient admitted, started on 324 mg of      aspirin.  Cardiac enzymes were obtained which were negative x3.      Chest x-ray was negative for any acute findings.  The patient's      symptoms are suspected to be GI related.  However, due to the      patient's risk factors mentioned above, the patient to be scheduled      for outpatient Myoview on Sep 25, 2007, at 9 a.m. to be followed up      by his primary care physician.  The patient instructed to start on      daily aspirin in addition to over-the-counter ranitidine to see if      this helps relieve symptoms.   MEDICATIONS AT THE TIME OF DISCHARGE:  1.  Metformin 500 mg p.o. b.i.d.  2. Aspirin 325 mg p.o. daily.  3. Ranitidine 150 mg p.o. daily.   LABORATORY AT THE TIME OF DISCHARGE:  White cell count 5.2. platelets  147, hemoglobin 14.3, hematocrit 42.4.  Sodium 136, potassium 3.6, BUN  16, creatinine 0.61, INR 1.0.  Again cardiac enzymes were negative x3.  Chest x-ray with no active disease.   DISPOSITION:  The patient felt medically stable for discharge to home at  this time.  He has had no recurrent chest pain since his initial episode  while at work.  The patient again is scheduled for outpatient Myoview on  Sep 25, 2007 at 9 a.m.  In addition, the patient is instructed to follow  with his primary care physician Dr. Willow Ora on Sep 26, 2007 at 3:45 p.m.  at which time Cardiolite results can be reviewed.  Cordelia Pen, NP      Raenette Rover. Felicity Coyer, MD  Electronically Signed    LE/MEDQ  D:  09/13/2007  T:  09/13/2007  Job:  [id number]   cc:   Willow Ora, MD

## 2010-10-06 NOTE — Discharge Summary (Signed)
NAME:  Daniel [name], Daniel [name] NO.:  [id number]   MEDICAL RECORD NO.:  [id number]          PATIENT TYPE:  OBV   LOCATION:  5502                         FACILITY:  [facility]   PHYSICIAN:  Valerie A. Felicity Coyer, MDDATE OF BIRTH:  [birth year]   DATE OF ADMISSION:  09/12/2007  DATE OF DISCHARGE:  09/13/2007                               DISCHARGE SUMMARY   DISCHARGE DIAGNOSES:  1. Substernal chest pain.  2. Type 2 diabetes.  3. Tobacco abuse.   HISTORY OF PRESENT ILLNESS:  Daniel [name] is a 55 year old Hispanic male  with past medical history of type 2 diabetes who presented to the office  on September 12, 2007, with reports of sudden onset of intense epigastric  pain while at work.  At the time of visit, the patient reported symptoms  had decreased gradually.  He denied any radiation of pain, but did  report some anterior chest pressure.  The patient states pain began  shortly after p.o. intake and with subsequent nausea, denied any  vomiting, unable to tell if any shortness of breath.  Due to the  patient's history of type 2 diabetes, family history of CAD, and current  tobacco use; the patient was admitted to the hospital for further  evaluation and treatment.   PAST MEDICAL HISTORY:  1. Vitiligo.  2. Type 2 diabetes.  3. Finger surgery.   COURSE OF HOSPITALIZATION:  As mentioned above, the patient with  positive risk factors for CAD including family history in father.  Also,  the patient with current tobacco abuse and obesity as well as type 2  diabetes.  Cardiac enzymes were cycled x3 which were negative for any  acute findings.  EKG revealed sinus bradycardia at 56 beats per minute  with no abnormal findings.  Chest x-ray done during this admission was  negative for any acute findings.  Due to the patient's multiple risk  factors, outpatient Myoview was scheduled at time of discharge.   MEDICATIONS AT TIME OF DISCHARGE:  1. Metformin 500 mg p.o. b.i.d.  2. Aspirin 325 mg  p.o. daily.  3. Ranitidine 150 mg p.o. daily.   PERTINENT LAB WORK:  At time of discharge, white cell count 5.2,  platelets 147, hemoglobin 14.3, and hematocrit 42.4.  Sodium 136,  potassium 3.6, BUN 16, creatinine 0.61, and INR 1.0.   DISPOSITION:  The patient felt medically stable for discharge to home as  he is pain-free since arrival to the hospital.  The patient is scheduled  for outpatient Myoview at [facility] on Sep 25, 2007, at 9:00  a.m.  He is instructed to follow up with his primary care physician Dr.  Drue [name] on Sep 26, 2007, at 3:45 p.m.      Cordelia Pen, NP      Raenette Rover. Felicity Coyer, MD  Electronically Signed    LE/MEDQ  D:  10/26/2007  T:  10/27/2007  Job:  [id number]

## 2010-10-08 ENCOUNTER — Telehealth: Payer: Self-pay | Admitting: *Deleted

## 2010-10-08 MED ORDER — SITAGLIPTIN PHOSPHATE 100 MG PO TABS: 100.0000 mg | ORAL_TABLET | Freq: Every day | ORAL | Status: DC

## 2010-10-08 NOTE — Telephone Encounter (Signed)
Message copied by Army Fossa on Thu Oct 08, 2010  1:43 PM ------      Message from: Willow Ora      Created: Thu Oct 08, 2010  1:13 PM       Advise patient:      Diabetes is not well controlled.      Add [place] 100mg  1 po qd, give samples and a Rx.      RTC 2 months

## 2010-10-08 NOTE — Telephone Encounter (Signed)
Message left for patient to return my call. Samples up front for pt. Rx sent in.

## 2010-10-09 NOTE — Telephone Encounter (Signed)
Message left for pt on home and cell phone.

## 2010-10-12 NOTE — Telephone Encounter (Signed)
Message left for patient to return my call.  

## 2010-10-13 ENCOUNTER — Encounter: Payer: Self-pay | Admitting: *Deleted

## 2010-10-13 NOTE — Telephone Encounter (Signed)
Will mail pt a letter.

## 2010-10-21 ENCOUNTER — Other Ambulatory Visit: Payer: Self-pay | Admitting: Internal Medicine

## 2010-11-23 ENCOUNTER — Other Ambulatory Visit: Payer: Self-pay | Admitting: Internal Medicine

## 2011-01-23 ENCOUNTER — Other Ambulatory Visit: Payer: Self-pay | Admitting: Internal Medicine

## 2011-01-26 NOTE — Telephone Encounter (Signed)
Rx Done . 

## 2011-01-28 NOTE — Telephone Encounter (Signed)
Rx called in to pharmacy. 

## 2011-02-01 ENCOUNTER — Ambulatory Visit (INDEPENDENT_AMBULATORY_CARE_PROVIDER_SITE_OTHER): Payer: Managed Care, Other (non HMO) | Admitting: Internal Medicine

## 2011-02-01 ENCOUNTER — Encounter: Payer: Self-pay | Admitting: Internal Medicine

## 2011-02-01 DIAGNOSIS — E119 Type 2 diabetes mellitus without complications: Secondary | ICD-10-CM

## 2011-02-01 DIAGNOSIS — Z0000 Encounter for general adult medical examination without abnormal findings: Secondary | ICD-10-CM | POA: Insufficient documentation

## 2011-02-01 NOTE — Patient Instructions (Signed)
Came back fasting within 1 week: PSA FLP AST ALT CBC---dx v70 A1C, microalb---dx DM

## 2011-02-01 NOTE — Progress Notes (Signed)
  Subjective:    Patient ID: Daniel [name], male    DOB: 19-Mar-1956, 55 y.o.   MRN: [id number]  HPI Complete physical exam Recovering  from shoulder surgery, he was unable to work for 8 weeks thus was quite inactive. Blood sugars around 130, recognizes that his diet needs to improve.  Past Medical History  Diagnosis Date  . Vitiligo     dx in the 90s  . Diabetes mellitus 05/17/07  . Hyperlipidemia   . Knee pain     w/u included a MRI of the knee (-) and back, dx w/ Spinal Stenosis (believed to be causing knee pain)  . Normal cardiac stress test 09/2007   Past Surgical History  Procedure Date  . Finger surgery   . Shoulder surgery 09-2010    LEFT   History   Social History  . Marital Status: Married    Spouse Name: N/A    Number of Children: 2  . 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 Smoking status: Current Some Day Smoker -- 0.1 packs/day    Types: Cigarettes  . Smokeless tobacco: Never Used  . Alcohol Use: Yes     rare  . Drug Use: No  . Sexually Active: Not on file   Other Topics Concern  . Not on file   Social History Narrative   From [place]----Diet:his diet is ok, wife is vegetarian----Exercise:  active at work    Family History  Problem Relation Age of Onset  . Coronary artery disease Father     mid 6s  . Hypertension Mother   . Stroke Mother   . Diabetes Father   . Diabetes Mother   . Colon cancer Neg Hx   . Prostate cancer Neg Hx      Review of Systems  Respiratory: Negative for cough and shortness of breath.   Cardiovascular: Negative for chest pain and leg swelling.  Gastrointestinal: Negative for abdominal pain and blood in stool. Diarrhea: rarely has diarrhea.  Genitourinary: Negative for dysuria, hematuria and difficulty urinating.  Psychiatric/Behavioral: Negative for behavioral problems. The patient is not nervous/anxious.        Objective:   Physical Exam    Constitutional: He is oriented to person, place, and time. He appears well-developed and well-nourished. No distress.  HENT:  Head: Normocephalic and atraumatic.  Neck: No thyromegaly present.  Cardiovascular: Normal rate, regular rhythm and normal heart sounds.   No murmur heard. Pulmonary/Chest: Effort normal and breath sounds normal. No respiratory distress. He has no wheezes. He has no rales.  Abdominal: Soft. Bowel sounds are normal. He exhibits no distension. There is no tenderness. There is no rebound.  Genitourinary: Rectum normal and prostate normal.  Musculoskeletal: He exhibits no edema.  Neurological: He is alert and oriented to person, place, and time.  Skin: He is not diaphoretic.  Psychiatric: He has a normal mood and affect. His behavior is normal. Judgment and thought content normal.          Assessment & Plan:

## 2011-02-01 NOTE — Assessment & Plan Note (Addendum)
Chronic poorly controlled , taking meftormin 1000 mg bid (was supposed to take more ) Issue discussed w/ pt: long term consequences  Will check labs, strongly consider amaryl v. lantus Pt states would be reluctant to start shots thus amaryl before biggest meal of the day (before going to work, 3th shift) would be the next step. Also we could increase metformin dose a little Pt states is commited to improve life style

## 2011-02-01 NOTE — Assessment & Plan Note (Addendum)
Td 10-07 Cscope for hemathochezia 07-2008 no polyps, (+) hemorrhoids, next 10 years   he used to smoke tobacco or q. day, now smokes rarely. Encouraged to quit ! Diet and exercise discussed Labs

## 2011-02-08 ENCOUNTER — Other Ambulatory Visit: Payer: Self-pay | Admitting: Internal Medicine

## 2011-02-08 DIAGNOSIS — Z0000 Encounter for general adult medical examination without abnormal findings: Secondary | ICD-10-CM

## 2011-02-08 DIAGNOSIS — E119 Type 2 diabetes mellitus without complications: Secondary | ICD-10-CM

## 2011-02-09 ENCOUNTER — Other Ambulatory Visit (INDEPENDENT_AMBULATORY_CARE_PROVIDER_SITE_OTHER): Payer: Managed Care, Other (non HMO)

## 2011-02-09 DIAGNOSIS — Z0000 Encounter for general adult medical examination without abnormal findings: Secondary | ICD-10-CM

## 2011-02-09 DIAGNOSIS — E119 Type 2 diabetes mellitus without complications: Secondary | ICD-10-CM

## 2011-02-09 LAB — CBC WITH DIFFERENTIAL/PLATELET
Basophils Absolute: 0 10*3/uL (ref 0.0–0.1)
HCT: 46 % (ref 39.0–52.0)
Lymphocytes Relative: 29.7 % (ref 12.0–46.0)
Lymphs Abs: 1.9 10*3/uL (ref 0.7–4.0)
Monocytes Relative: 6.8 % (ref 3.0–12.0)
Platelets: 165 10*3/uL (ref 150.0–400.0)
RDW: 15.1 % — ABNORMAL HIGH (ref 11.5–14.6)

## 2011-02-09 LAB — LIPID PANEL
Cholesterol: 156 mg/dL (ref 0–200)
LDL Cholesterol: 91 mg/dL (ref 0–99)
Triglycerides: 65 mg/dL (ref 0.0–149.0)

## 2011-02-09 LAB — HEMOGLOBIN A1C: Hgb A1c MFr Bld: 8.4 % — ABNORMAL HIGH (ref 4.6–6.5)

## 2011-02-09 LAB — AST: AST: 25 U/L (ref 0–37)

## 2011-02-09 LAB — MICROALBUMIN / CREATININE URINE RATIO: Microalb Creat Ratio: 1.3 mg/g (ref 0.0–30.0)

## 2011-02-09 LAB — ALT: ALT: 27 U/L (ref 0–53)

## 2011-02-12 LAB — PSA: PSA: 0.85 ng/mL (ref 0.10–4.00)

## 2011-02-12 MED ORDER — GLIMEPIRIDE 4 MG PO TABS: 4.0000 mg | ORAL_TABLET | Freq: Every day | ORAL | Status: DC

## 2011-02-16 LAB — DIFFERENTIAL
Basophils Relative: 1
Eosinophils Absolute: 0.2
Eosinophils Relative: 5
Monocytes Absolute: 0.4
Monocytes Relative: 8
Neutro Abs: 3.2

## 2011-02-16 LAB — CBC
MCHC: 34
MCV: 84.3
Platelets: 147 — ABNORMAL LOW
RDW: 14
WBC: 5.2

## 2011-02-16 LAB — CK TOTAL AND CKMB (NOT AT ARMC): Total CK: 112

## 2011-02-16 LAB — CARDIAC PANEL(CRET KIN+CKTOT+MB+TROPI)
Relative Index: INVALID
Relative Index: INVALID
Total CK: 83

## 2011-02-16 LAB — BASIC METABOLIC PANEL
BUN: 16
Chloride: 105
Creatinine, Ser: 0.61
Glucose, Bld: 159 — ABNORMAL HIGH

## 2011-02-16 LAB — APTT: aPTT: 27

## 2011-02-20 ENCOUNTER — Other Ambulatory Visit: Payer: Self-pay | Admitting: Internal Medicine

## 2011-02-22 NOTE — Telephone Encounter (Signed)
Pt last seen on 02/01/11, labs on 02/09/11.  Pt has follow up on 05/04/11.  RX sent until that time.

## 2011-03-08 ENCOUNTER — Other Ambulatory Visit: Payer: Self-pay | Admitting: Internal Medicine

## 2011-03-08 MED ORDER — GLIMEPIRIDE 4 MG PO TABS: 4.0000 mg | ORAL_TABLET | Freq: Every day | ORAL | Status: DC

## 2011-03-08 NOTE — Telephone Encounter (Signed)
done

## 2011-03-10 ENCOUNTER — Other Ambulatory Visit: Payer: Self-pay | Admitting: Internal Medicine

## 2011-03-10 NOTE — Telephone Encounter (Signed)
Spoke w/ pt aware of instructions and clarified that medication was to be restarted since he was on it in the past.

## 2011-03-10 NOTE — Telephone Encounter (Signed)
Patient had ov [phone] - lab [phone] - he said dr Drue Novel changed diabetes med but when he picked med up glimepride 4 mg was called in - patient said he already takes glimepride along with metformin 1000mg  & januvia 100mg   -- he wants to know what new med is & wants it called in to [facility] [address] [place]

## 2011-03-26 ENCOUNTER — Ambulatory Visit (INDEPENDENT_AMBULATORY_CARE_PROVIDER_SITE_OTHER): Payer: Managed Care, Other (non HMO) | Admitting: Internal Medicine

## 2011-03-26 ENCOUNTER — Ambulatory Visit: Payer: Managed Care, Other (non HMO) | Admitting: Internal Medicine

## 2011-03-26 ENCOUNTER — Encounter: Payer: Self-pay | Admitting: Internal Medicine

## 2011-03-26 VITALS — BP 134/74 | HR 60 | Temp 97.8°F | Resp 18 | Ht 74.41 in | Wt 231.4 lb

## 2011-03-26 DIAGNOSIS — E119 Type 2 diabetes mellitus without complications: Secondary | ICD-10-CM

## 2011-03-26 DIAGNOSIS — M79609 Pain in unspecified limb: Secondary | ICD-10-CM

## 2011-03-26 DIAGNOSIS — M779 Enthesopathy, unspecified: Secondary | ICD-10-CM

## 2011-03-26 DIAGNOSIS — Z23 Encounter for immunization: Secondary | ICD-10-CM

## 2011-03-26 MED ORDER — SITAGLIPTIN PHOSPHATE 100 MG PO TABS: 100.0000 mg | ORAL_TABLET | Freq: Every day | ORAL | Status: DC

## 2011-03-26 MED ORDER — PIOGLITAZONE HCL 30 MG PO TABS: 30.0000 mg | ORAL_TABLET | Freq: Every day | ORAL | Status: DC

## 2011-03-26 MED ORDER — METFORMIN HCL 1000 MG PO TABS: 1000.0000 mg | ORAL_TABLET | Freq: Two times a day (BID) | ORAL | Status: DC

## 2011-03-26 MED ORDER — PRAVASTATIN SODIUM 40 MG PO TABS: 40.0000 mg | ORAL_TABLET | Freq: Every evening | ORAL | Status: DC

## 2011-03-26 NOTE — Patient Instructions (Signed)
Keep the appointment you have with me on 05/04/2011  at 2:45 PM Warm compress twice a day Motrin 200 mg OTC 2 tabs every 6 hours as needed, watch for stomach irritation, take with food

## 2011-03-26 NOTE — Progress Notes (Signed)
  Subjective:    Patient ID: Daniel [name], male    DOB: 05-05-1956, 55 y.o.   MRN: [id number]  HPI 3 weeks history of pain at the base of the left third finger. Pain is worse in the morning, range of motion is also decreased in the morning, as the day goes by he uses his hand and recuperates the range of motion. Diabetes, he is taking his medication correctly. Since we changed his regimen, his blood sugar has decreased to some extent. Needs several refills.  Past Medical History  Diagnosis Date  . Vitiligo     dx in the 90s  . Diabetes mellitus 05/17/07  . Hyperlipidemia   . Knee pain     w/u included a MRI of the knee (-) and back, dx w/ Spinal Stenosis (believed to be causing knee pain)  . Normal cardiac stress test 09/2007   Past Surgical History  Procedure Date  . Finger surgery   . Shoulder surgery 09-2010    LEFT     Review of Systems Denies any hand injury per se however he works with his hands a lot in a factory. Denies any hand redness, cuts. No other joints or areas are hurting     Objective:   Physical Exam  Constitutional: He appears well-developed and well-nourished.  HENT:  Head: Normocephalic and atraumatic.  Musculoskeletal:       Right hand and wrist normal. Left wrist normal Left hand: Tender at the palmar aspect close to the third finger. No mass, pain is worse when I apply pressure in the area and the patient flex his finger          Assessment & Plan:

## 2011-03-26 NOTE — Assessment & Plan Note (Addendum)
Symptoms consistent with tendinitis at the left hand, likely from overuse. While I recommended Motrin, I think he will benefit from seeing an orthopedic doctor and get a local injection that hopefully will heal the area quiker  (understanding that it may increase his blood sugar a little bit). We'll arrange a referral

## 2011-03-26 NOTE — Assessment & Plan Note (Addendum)
RF  his medication. Apparently the sugars are slightly better than before since we changed his regimen base on last A1C

## 2011-05-04 ENCOUNTER — Ambulatory Visit (INDEPENDENT_AMBULATORY_CARE_PROVIDER_SITE_OTHER): Payer: Managed Care, Other (non HMO) | Admitting: Internal Medicine

## 2011-05-04 VITALS — BP 100/76 | HR 69 | Temp 97.6°F | Ht 74.0 in | Wt 232.0 lb

## 2011-05-04 DIAGNOSIS — M779 Enthesopathy, unspecified: Secondary | ICD-10-CM

## 2011-05-04 DIAGNOSIS — E119 Type 2 diabetes mellitus without complications: Secondary | ICD-10-CM

## 2011-05-04 NOTE — Assessment & Plan Note (Signed)
resolved after local injection

## 2011-05-04 NOTE — Assessment & Plan Note (Addendum)
Improving,labs. If not at goal we'll have to consider insulin or increase amaryl. Patient aware

## 2011-05-04 NOTE — Progress Notes (Signed)
  Subjective:    Patient ID: Daniel Daniel [name], male    DOB: 08/19/1955, 55 y.o.   MRN: [id number]  HPI Diabetes followup--- since the last office visit we put him back  on Amaryl, blood sugars are definitely better. At times blood sugar has been as low as 72 and he felt nervous and sweaty. Since then, he had "spread"  his medications throughout the day and has not seen more low CBGs. Currently, CBGs around 100-115.  Past Medical History  Diagnosis Date  . Vitiligo     dx in the 90s  . Diabetes mellitus 05/17/07  . Hyperlipidemia   . Knee pain     w/u included a MRI of the knee (-) and back, dx w/ Spinal Stenosis (believed to be causing knee pain)  . Normal cardiac stress test 09/2007   Past Surgical History  Procedure Date  . Finger surgery   . Shoulder surgery 09-2010    LEFT     Review of Systems No nausea, vomiting, diarrhea Tendinitis better after they did  a local injection     Objective:   Physical Exam  Constitutional: He appears well-developed and well-nourished.  Cardiovascular: Normal rate, regular rhythm and normal Daniel [name] sounds.   No murmur heard. Pulmonary/Chest: Effort normal and breath sounds normal. No respiratory distress. He has no wheezes. He has no rales.  Musculoskeletal: He exhibits no edema.       Assessment & Plan:

## 2011-05-05 LAB — BASIC METABOLIC PANEL
CO2: 29 mEq/L (ref 19–32)
Calcium: 9.3 mg/dL (ref 8.4–10.5)
Glucose, Bld: 164 mg/dL — ABNORMAL HIGH (ref 70–99)
Sodium: 138 mEq/L (ref 135–145)

## 2011-05-06 ENCOUNTER — Telehealth: Payer: Self-pay | Admitting: Internal Medicine

## 2011-05-06 NOTE — Telephone Encounter (Signed)
Base on results, we need to increase amaryl dose. Will call pt in AM

## 2011-05-07 NOTE — Telephone Encounter (Signed)
Left detailed message: Increase Amaryl from 4 mg to 6 mg Return to the office in 2 months.

## 2011-06-08 ENCOUNTER — Other Ambulatory Visit: Payer: Self-pay | Admitting: Internal Medicine

## 2011-06-30 ENCOUNTER — Other Ambulatory Visit: Payer: Self-pay | Admitting: *Deleted

## 2011-06-30 ENCOUNTER — Other Ambulatory Visit: Payer: Self-pay | Admitting: Internal Medicine

## 2011-06-30 MED ORDER — GLIMEPIRIDE 4 MG PO TABS: 4.0000 mg | ORAL_TABLET | Freq: Every day | ORAL | Status: DC

## 2011-06-30 NOTE — Telephone Encounter (Signed)
Refill done.  

## 2011-07-09 ENCOUNTER — Ambulatory Visit (INDEPENDENT_AMBULATORY_CARE_PROVIDER_SITE_OTHER): Payer: Managed Care, Other (non HMO) | Admitting: Internal Medicine

## 2011-07-09 DIAGNOSIS — E785 Hyperlipidemia, unspecified: Secondary | ICD-10-CM

## 2011-07-09 DIAGNOSIS — M79609 Pain in unspecified limb: Secondary | ICD-10-CM

## 2011-07-09 DIAGNOSIS — E119 Type 2 diabetes mellitus without complications: Secondary | ICD-10-CM

## 2011-07-09 MED ORDER — SITAGLIPTIN PHOSPHATE 100 MG PO TABS: 100.0000 mg | ORAL_TABLET | Freq: Every day | ORAL | Status: DC

## 2011-07-09 MED ORDER — GLIMEPIRIDE 4 MG PO TABS: 6.0000 mg | ORAL_TABLET | Freq: Every day | ORAL | Status: DC

## 2011-07-09 MED ORDER — PIOGLITAZONE HCL 30 MG PO TABS: 30.0000 mg | ORAL_TABLET | Freq: Every day | ORAL | Status: DC

## 2011-07-09 MED ORDER — METFORMIN HCL 1000 MG PO TABS: 1000.0000 mg | ORAL_TABLET | Freq: Two times a day (BID) | ORAL | Status: DC

## 2011-07-09 NOTE — Assessment & Plan Note (Addendum)
Poorly controlled chronically, consequences of poorly controlled diabetes discussed. Next step is injectables. Patient is quite reluctant. If he is not at goal we agreed that he will see a specialist.

## 2011-07-09 NOTE — Assessment & Plan Note (Signed)
See hyperlipidemia

## 2011-07-09 NOTE — Progress Notes (Signed)
  Subjective:    Patient ID: Daniel [name], male    DOB: 01-26-1956, 56 y.o.   MRN: [id number]  HPI Routine office visit Since the last time he was here, we increased theAmaryl dose. Good medication compliance. His blood sugars are around 97 fasting in the 120 nonfasting. As far as cholesterol, good medication compliance, his diet is healthy.  Past Medical History  Diagnosis Date  . Vitiligo     dx in the 90s  . Diabetes mellitus 05/17/07  . Hyperlipidemia   . Knee pain     w/u included a MRI of the knee (-) and back, dx w/ Spinal Stenosis (believed to be causing knee pain)  . Normal cardiac stress test 09/2007   Past Surgical History  Procedure Date  . Finger surgery   . Shoulder surgery 09-2010    LEFT     Review of Systems No nausea, vomiting, diarrhea. Today he reports that from time to time he has palpitations, ongoing for years, not associated with nausea, chest pain, sweats. No history of syncope. Symptoms last a few seconds. He is also concerned about neuropathy, wonders if he has it. Denies any burning feet or hands type pain. He does have some ill-defined muscle aches in both arms.     Objective:   Physical Exam  Constitutional: He is oriented to person, place, and time. He appears well-developed and well-nourished.  Cardiovascular: Normal rate and regular rhythm.   No murmur heard. Musculoskeletal: He exhibits no edema.  Neurological: He is alert and oriented to person, place, and time.  Psychiatric: He has a normal mood and affect. His behavior is normal. Judgment and thought content normal.       Assessment & Plan:  Today , I spent more than 25  min with the patient, >50% of the time counseling in reference to   Diabetes, we reviewed the consequences of uncontrolled diabetes we went over his previous A1c's. We'll also review  previous cholesterol panels.

## 2011-07-09 NOTE — Assessment & Plan Note (Addendum)
Well-controlled, he does have some ill-defined muscle aches, on chart review, this is ongoing x few years, at some point I asked him to hold Pravachol but he doesn't recall if he did it  or not. Plan: Hold Pravachol for 3 weeks and see how he feels. Patient to keep me inform.

## 2011-07-11 ENCOUNTER — Encounter: Payer: Self-pay | Admitting: Internal Medicine

## 2011-07-14 NOTE — Progress Notes (Signed)
Addended by: Edwena Felty T on: 07/14/2011 08:48 AM   Modules accepted: Orders

## 2011-07-30 ENCOUNTER — Encounter: Payer: Self-pay | Admitting: Endocrinology

## 2011-07-30 ENCOUNTER — Ambulatory Visit (INDEPENDENT_AMBULATORY_CARE_PROVIDER_SITE_OTHER): Payer: Managed Care, Other (non HMO) | Admitting: Endocrinology

## 2011-07-30 DIAGNOSIS — E119 Type 2 diabetes mellitus without complications: Secondary | ICD-10-CM

## 2011-07-30 MED ORDER — GLIMEPIRIDE 2 MG PO TABS: 2.0000 mg | ORAL_TABLET | Freq: Every day | ORAL | Status: DC

## 2011-07-30 NOTE — Patient Instructions (Addendum)
good diet and exercise habits significanly improve the control of your diabetes.  please let me know if you wish to be referred to a dietician.  high blood sugar is very risky to your health.  you should see an eye doctor every year. controlling your blood pressure and cholesterol drastically reduces the damage diabetes does to your body.  this also applies to quitting smoking.  please discuss these with your doctor.  you should take an aspirin every day, unless you have been advised by a doctor not to. check your blood sugar 1 time a day.  vary the time of day when you check, between before the 3 meals, and at bedtime.  also check if you have symptoms of your blood sugar being too high or too low.  please keep a record of the readings and bring it to your next appointment here.  please call us sooner if your blood sugar goes below 70, or if it stays over 200. Please come back for a follow-up appointment in 6 weeks.   we will need to take this complex situation in stages. Reduce glimepiride to 2 mg daily.  i have sent a prescription to your pharmacy.      Gua de planeamiento de la alimentacin para diabticos (Diabetes Meal Planning Guide) La gua de planeamiento de alimentacin para diabticos es una herramienta para ayudarlo a planear sus comidas y colaciones. Es importante para las personas con diabetes controlar sus niveles de International aid/development worker. Elegir los [employer] correctos y las cantidades adecuadas durante el da le ayudar a Technical brewer. [place]er bien puede incluso ayudarlo a mejorar la presin sangunea y Barista o Pharmacologist un peso saludable. CUENTE LOS HIDRATOS DE CARBONO CON FACILIDAD Cuando consume hidratos de carbono, stos se transforman en azcar (glucosa). Esto a su vez Counsellor de Production assistant, radio. El conteo de carbohidratos puede ayudarlo a Chief Operating Officer este nivel para que se sienta mejor. Al planear sus alimentos con el conteo de carbohidratos, podr tener ms flexibilidad  en lo que come y Physiological scientist con el consumo de alimentos. El conteo de carbohidratos significa simplemente sumar la cantidad total de gramos de carbohidratos a sus comidas o colaciones. Trate de consumir la misma cantidad en cada comida. A continuacin encontrar una lista de 1 porcin o 15 gr. de carbohidratos. A continuacin se enumeran. Pregunte al mdico cuntos gramos de carbohidratos necesita comer en cada comida o colacin. Almidones y granos  1 [place] de pan.    bollo ingls o bollo para hamburguesa o hotdog.    taza de cereal fro (sin azcar).   ? taza de pasta o arroz cocido.    taza de vegetales que contengan almidn (maz, papas, arvejas, porotos, calabaza).   1 omelette (6 pulgadas).    bollo.   1 waffle o panqueque (del tamao de un CD).    taza de cereales cocidos.   4 a 6 galletas saldas pequeas.  *Se recomienda el consumo de granos enteros. Frutas  1 taza de frutos rojos, meln, papaya o anan sin azcar.   1 fruta fresca pequea.    banana o mango.    taza de jugo de frutas (4 onzas sin endulzar).    taza de fruta envasada en jugo natural o agua.   2 cucharadas de frutas secas.   12-15 uvas o cerezas.  Leche y yogurt  1 taza de [employer] o al 1%.   1 taza de leche de soja.   6  onzas de yogurt descremado con edulcorante sin azcar.   6 onzas de yogur descremado de soja.   6 onzas de yogur natural.  Vegetales  1 taza de vegetales crudos o  de vegetales cocidos se considera cero carbohidratos o una comida "libre".   Si come 3 o ms porciones en una comida, cuntelas como 1 porcin de carbohidratos.  Otros carbohidratos   onzas de chips o pretzels.    taza de helado de crema o yogur helado.    taza de helado de agua.   5 cm de torta no congelada.   1 cucharada de miel, azcar, mermelada, jalea o almbar.   2 galletitas dulces pequeas.   3 cuadrados de crackers de graham.   3 tazas de palomitas de  maz.   6 crackers.   1 taza de caldo.   Cuente 1 taza de guisado u otra mezcla de alimentos como 2 porciones de carbohidratos.   Los alimentos con menos de 20 caloras por porcin deben contarse como cero carbohidratos o alimento "libre".  Si lo desea compre un libro o software de computacin que enumere la cantidad de gramos de carbohidratos de los diferentes alimentos. Adems, el panel nutricional en las etiquetas de los productos que consume es una buena fuente de informacin. Le indicar el tamao de la porcin y la cantidad total de carbohidratos que consumir por cada una. Divida este nmero por 15 para obtener el nmero de conteo de carbohidratos por porcin. Recuerde: cada porcin son 29 gramos de carbohidratos. PORCIONES La medicin de los alimentos y el tamao de las porciones lo ayudarn a Scientist, physiological cantidad exacta de comida que debe ingerir. La lista que sigue le mostrar el tamao de algunas porciones comunes.    1 onza.................4 dados apilados.   3 onzas..............Marland KitchenUn mazo de cartas.   1 cucharadita...Marland KitchenMarland KitchenLa punta de un dedo pequeo.   1 cucharada.......Marland KitchenUn dedo.   2 cucharadas....Marland KitchenMarland KitchenUna pelota de golf.    taza..............Marland KitchenLa mitad de un puo.   1 taza...............Marland KitchenUn puo.  EJEMPLO DE PLAN DE ALIMENTACIN PARA DIABTICOS: A continuacin se muestra un ejemplo de plan de alimentacin que incluye comidas de los grupos de granos y [place], Sports administrator, frutas y carnes. Un nutricionista podr confeccionarle un plan individualizado para cubrir sus necesidades calricas y decirle el nmero de porciones que necesita de [place]. Sin embargo, podra [employer] alimentos que contengan carbohidratos (lcteos, cereales y frutas). Controlar la cantidad total de carbohidratos en los alimentos o colaciones es ms importante que asegurarse de incluir todos los grupos alimenticios cada vez que come.   El siguiente plan de alimentacin es un ejemplo de una dieta de  2000 caloras mediante el conteo de carbohidratos. Este plan contiene 17 porciones de carbohidratos. Desayuno  1 taza de avena (2 porciones de carbohidratos).    taza de yogur light(1 porcin de carbohidratos).   1 taza de arndanos (1 porcin de carbohidratos).    taza de almendras.  Colacin  1 manzana grande (2 porciones de carbohidratos).   1 palito de queso bajo [employer].  Almuerzo  Ensalada de pechuga de pollo.   1 taza de espinacas.    taza de tomates cortados.   2 oz (60 gr) de pechuga de pollo en rebanadas.   2 cucharadas de aderezo italiano bajo en [employer].   12 galletas integrales (2 porciones de carbohidratos).   12 a 15 uvas (1 porcin de carbohidratos).   1 taza de [employer] (1porcin de carbohidratos).  Colacin  1  taza de zanahorias.    taza de pur de garbanzos (1 porcin de carbohidratos).  Cena  3 oz (80 gr) de salmn a la parrilla.   1 taza de arroz integral (3 porciones de carbohidratos).  Colacin  1  taza de brcoli al vapor (1 porcin de carbohidrato) con una cucharadita de aceite de oliva y jugo de limn.   1 taza de budn light (2 porciones de carbohidratos).  HOJA DE PLANEAMIENTO DE LA ALIMENTACIN: El dietista podr utilizar esta hoja para ayudarlo a decidir cuntas porciones y qu tipos de alimentos son los adecuados para usted.   DESAYUNO Grupo de alimentos y porciones / Alimento elegido Granos/Fculas_________________________________________________ Lcteos________________________________________________________ Rufina Falco ______________________________________________________ Lou Miner __________________________________________________________ Charlesetta Ivory _________________________________________________________ Rosalin Hawking _________________________________________________________ Lorin Mercy de alimentos y porciones / Alimento  elegido Granos/Fculas___________________________________________________ Lcteos_________________________________________________________ Lou Miner ___________________________________________________________ Charlesetta Ivory __________________________________________________________ Rosalin Hawking __________________________________________________________ Danford Bad de alimentos y porciones / Alimento elegido Granos/Fculas___________________________________________________ Lcteos_________________________________________________________ Lou Miner ___________________________________________________________ Charlesetta Ivory __________________________________________________________ Rosalin Hawking __________________________________________________________ Jettie Pagan de alimentos y porciones / Alimento elegido Granos/Fculas_________________________________________________ Lcteos________________________________________________________ Rufina Falco ______________________________________________________ Lou Miner _________________________________________________________ Charlesetta Ivory ________________________________________________________ Rosalin Hawking ________________________________________________________ Carolyn Stare DIARIOS Fculas_______________________________________________________ Vegetales _____________________________________________________ Lou Miner ________________________________________________________ Lcteos_______________________________________________________ Carnes________________________________________________________ Rosalin Hawking ________________________________________________________ Document Released: 08/17/2007 Document Revised: 04/29/2011 ExitCare Patient Information 2012 [employer].

## 2011-07-30 NOTE — Progress Notes (Signed)
Subjective:    Patient ID: Daniel [name], male    DOB: 1955/11/13, 56 y.o.   MRN: [id number]  HPI pt states 3 years h/o dm.  he is unaware of any chronic complications.  he has never been on insulin.  he takes 3 orals.  pt says his diet and exercise are both good.   Pt states few mos of intermittent slight tremor of the hands, in the context of fasting.  It is relieved by eating.   Past Medical History  Diagnosis Date  . Vitiligo     dx in the 90s  . Diabetes mellitus 05/17/07  . Hyperlipidemia   . Knee pain     w/u included a MRI of the knee (-) and back, dx w/ Spinal Stenosis (believed to be causing knee pain)  . Normal cardiac stress test 09/2007    Past Surgical History  Procedure Date  . Finger surgery   . Shoulder surgery 09-2010    LEFT    History   Social History  . Marital Status: Married    Spouse Name: N/A    Number of Children: 2  . 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 Smoking status: Former Smoker -- 0.1 packs/day    Types: Cigarettes    Quit date: 05/25/2011  . Smokeless tobacco: Never Used  . Alcohol Use: Yes     rare  . Drug Use: No  . Sexually Active: Not on file   Other Topics Concern  . Not on file   Social History Narrative   From [place]----Diet:his diet is ok, wife is vegetarian----Exercise:  active at work     Current Outpatient Prescriptions on File Prior to Visit  Medication Sig Dispense Refill  . glimepiride (AMARYL) 4 MG tablet Take 1.5 tablets (6 mg total) by mouth daily.  60 tablet  6  . glucose blood test strip Use as instructed  100 each  2  . metFORMIN (GLUCOPHAGE) 1000 MG tablet Take 1 tablet (1,000 mg total) by mouth 2 (two) times daily with a meal.  60 tablet  6  . ONE TOUCH LANCETS MISC by Does not apply route 4 (four) times daily.        . pioglitazone (ACTOS) 30 MG tablet Take 1 tablet (30 mg total) by mouth daily.  30 tablet  6  . sitaGLIPtin (JANUVIA) 100  MG tablet Take 1 tablet (100 mg total) by mouth daily.  30 tablet  6  . pravastatin (PRAVACHOL) 40 MG tablet Take 1 tablet (40 mg total) by mouth Nightly.  30 tablet  12    No Known Allergies  Family History  Problem Relation Age of Onset  . Coronary artery disease Father     mid 69s  . Hypertension Mother   . Stroke Mother   . Diabetes Father   . Diabetes Mother   . Colon cancer Neg Hx   . Prostate cancer Neg Hx     BP 112/72  Pulse 56  Temp(Src) 98.1 F (36.7 C) (Oral)  Ht 6\' 2"  (1.88 m)  Wt 231 lb 6.4 oz (104.962 kg)  BMI 29.71 kg/m2  SpO2 97%    Review of Systems denies weight loss, blurry vision, headache, chest pain, sob, n/v, urinary frequency, excessive diaphoresis, memory loss,  and easy bruising.  He has intermittent leg cramps x many years.  He has slight depression and rhinorrhea.  Objective:   Physical Exam VS: see vs page GEN: no distress HEAD: head: no deformity eyes: no periorbital swelling, no proptosis external nose and ears are normal mouth: no lesion seen NECK: supple, thyroid is not enlarged CHEST WALL: no deformity LUNGS: clear to auscultation BREASTS:  No gynecomastia CV: reg rate and rhythm, no murmur ABD: abdomen is soft, nontender.  no hepatosplenomegaly.  not distended.  no hernia MUSCULOSKELETAL: muscle bulk and strength are grossly normal.  no obvious joint swelling.  gait is normal and steady EXTEMITIES: no deformity.  no ulcer on the feet.  feet are of normal color and temp.  no edema PULSES: dorsalis pedis intact bilat.  no carotid bruit NEURO:  cn 2-12 grossly intact.   readily moves all 4's.  sensation is intact to touch on the feet SKIN:  Normal texture and temperature.  No rash or suspicious lesion is visible.   NODES:  None palpable at the neck PSYCH: alert, oriented x3.  Does not appear anxious nor depressed.   Lab Results  Component Value Date   HGBA1C 7.4* 07/09/2011      Assessment & Plan:  DM, Needs increased  rx, if it can be done with a regimen that avoids or minimizes hypoglycemia. Tremor, possibly due to hypoglycemia Tobacco abuse.  This will exac the complications of DM Leg cramps, possible exac by DM

## 2011-09-06 ENCOUNTER — Ambulatory Visit: Payer: Managed Care, Other (non HMO) | Admitting: Internal Medicine

## 2011-09-10 ENCOUNTER — Ambulatory Visit (INDEPENDENT_AMBULATORY_CARE_PROVIDER_SITE_OTHER): Payer: BC Managed Care – PPO | Admitting: Endocrinology

## 2011-09-10 ENCOUNTER — Encounter: Payer: Self-pay | Admitting: Endocrinology

## 2011-09-10 VITALS — BP 104/82 | HR 52 | Temp 97.9°F | Ht 74.0 in | Wt 231.0 lb

## 2011-09-10 DIAGNOSIS — E119 Type 2 diabetes mellitus without complications: Secondary | ICD-10-CM

## 2011-09-10 MED ORDER — GLIMEPIRIDE 1 MG PO TABS: 1.0000 mg | ORAL_TABLET | Freq: Every day | ORAL | Status: DC

## 2011-09-10 MED ORDER — BROMOCRIPTINE MESYLATE 2.5 MG PO TABS: 2.5000 mg | ORAL_TABLET | Freq: Every day | ORAL | Status: DC

## 2011-09-10 NOTE — Progress Notes (Signed)
Subjective:    Patient ID: Daniel [name], male    DOB: 1956-02-03, 56 y.o.   MRN: [id number]  HPI Pt returns for f/u of type 2 DM (dx'ed 2010--no known complications).  Tremor has resolved.  no cbg record, but states cbg's are well-controlled. Past Medical History  Diagnosis Date  . Vitiligo     dx in the 90s  . Diabetes mellitus 05/17/07  . Hyperlipidemia   . Knee pain     w/u included a MRI of the knee (-) and back, dx w/ Spinal Stenosis (believed to be causing knee pain)  . Normal cardiac stress test 09/2007    Past Surgical History  Procedure Date  . Finger surgery   . Shoulder surgery 09-2010    LEFT    History   Social History  . Marital Status: Married    Spouse Name: N/A    Number of Children: 2  . 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 Smoking status: Former Smoker -- 0.1 packs/day    Types: Cigarettes    Quit date: 05/25/2011  . Smokeless tobacco: Never Used  . Alcohol Use: Yes     rare  . Drug Use: No  . Sexually Active: Not on file   Other Topics Concern  . Not on file   Social History Narrative   From [place]----Diet:his diet is ok, wife is vegetarian----Exercise:  active at work     Current Outpatient Prescriptions on File Prior to Visit  Medication Sig Dispense Refill  . metFORMIN (GLUCOPHAGE) 1000 MG tablet Take 1 tablet (1,000 mg total) by mouth 2 (two) times daily with a meal.  60 tablet  6  . ONE TOUCH LANCETS MISC by Does not apply route 4 (four) times daily.        . pioglitazone (ACTOS) 30 MG tablet Take 1 tablet (30 mg total) by mouth daily.  30 tablet  6  . pravastatin (PRAVACHOL) 40 MG tablet Take 1 tablet (40 mg total) by mouth Nightly.  30 tablet  12  . sitaGLIPtin (JANUVIA) 100 MG tablet Take 1 tablet (100 mg total) by mouth daily.  30 tablet  6  . bromocriptine (PARLODEL) 2.5 MG tablet Take 1 tablet (2.5 mg total) by mouth daily.  30 tablet  11  . glimepiride (AMARYL)  4 MG tablet Take 2 mg by mouth daily.      Marland Kitchen DISCONTD: glimepiride (AMARYL) 4 MG tablet Take 1 tablet (4 mg total) by mouth daily.  30 tablet  3    No Known Allergies  Family History  Problem Relation Age of Onset  . Coronary artery disease Father     mid 71s  . Hypertension Mother   . Stroke Mother   . Diabetes Father   . Diabetes Mother   . Colon cancer Neg Hx   . Prostate cancer Neg Hx     BP 104/82  Pulse 52  Temp(Src) 97.9 F (36.6 C) (Oral)  Ht 6\' 2"  (1.88 m)  Wt 231 lb (104.781 kg)  BMI 29.66 kg/m2  SpO2 97%   Review of Systems denies hypoglycemia    Objective:   Physical Exam VITAL SIGNS:  See vs page GENERAL: no distress PSYCH: Alert and oriented x 3.  Does not appear anxious nor depressed.      Assessment & Plan:  DM, with the apparent resolution of hypoglycemia.  He is ready to continue the  transition away from glimepiride.

## 2011-09-10 NOTE — Patient Instructions (Addendum)
check your blood sugar 1 time a day.  vary the time of day when you check, between before the 3 meals, and at bedtime.  also check if you have symptoms of your blood sugar being too high or too low.  please keep a record of the readings and bring it to your next appointment here.  please call us sooner if your blood sugar goes below 70, or if it stays over 200. Please come back for a follow-up appointment in 6 weeks.   Reduce glimepiride to 1 mg daily.   Start "bromocriptine" at bedtime.  To avoid side-effects of nausea and dizziness (which go away with time), take just 1/2 pill the first week.  i have sent a prescription to your pharmacy.

## 2011-10-04 ENCOUNTER — Emergency Department (INDEPENDENT_AMBULATORY_CARE_PROVIDER_SITE_OTHER): Payer: Worker's Compensation

## 2011-10-04 ENCOUNTER — Emergency Department (HOSPITAL_BASED_OUTPATIENT_CLINIC_OR_DEPARTMENT_OTHER)
Admission: EM | Admit: 2011-10-04 | Discharge: 2011-10-05 | Disposition: A | Discharge status: Home / Self Care | Payer: Worker's Compensation | Attending: Emergency Medicine | Admitting: Emergency Medicine

## 2011-10-04 ENCOUNTER — Encounter (HOSPITAL_BASED_OUTPATIENT_CLINIC_OR_DEPARTMENT_OTHER): Payer: Self-pay | Admitting: *Deleted

## 2011-10-04 DIAGNOSIS — Y990 Civilian activity done for income or pay: Secondary | ICD-10-CM | POA: Insufficient documentation

## 2011-10-04 DIAGNOSIS — Y9289 Other specified places as the place of occurrence of the external cause: Secondary | ICD-10-CM | POA: Insufficient documentation

## 2011-10-04 DIAGNOSIS — E785 Hyperlipidemia, unspecified: Secondary | ICD-10-CM | POA: Insufficient documentation

## 2011-10-04 DIAGNOSIS — S61409A Unspecified open wound of unspecified hand, initial encounter: Secondary | ICD-10-CM

## 2011-10-04 DIAGNOSIS — X58XXXA Exposure to other specified factors, initial encounter: Secondary | ICD-10-CM

## 2011-10-04 DIAGNOSIS — IMO0002 Reserved for concepts with insufficient information to code with codable children: Secondary | ICD-10-CM

## 2011-10-04 DIAGNOSIS — E119 Type 2 diabetes mellitus without complications: Secondary | ICD-10-CM | POA: Insufficient documentation

## 2011-10-04 DIAGNOSIS — W319XXA Contact with unspecified machinery, initial encounter: Secondary | ICD-10-CM | POA: Insufficient documentation

## 2011-10-04 DIAGNOSIS — M79609 Pain in unspecified limb: Secondary | ICD-10-CM | POA: Insufficient documentation

## 2011-10-04 NOTE — ED Notes (Signed)
Patient transported to X-ray 

## 2011-10-04 NOTE — ED Notes (Signed)
Laceration to posterior left hand from metal- on the job injury

## 2011-10-04 NOTE — ED Provider Notes (Signed)
History    This chart was scribed for Daniel Devoss Smitty Cords, MD, MD by [name]. The patient was seen in room MH03 and the patient's care was started at 11:43PM.   CSN: [id number]  Arrival date & time 10/04/11  2303   First MD Initiated Contact with Patient 10/04/11 2343      Chief Complaint  Patient presents with  . Hand Injury    (Consider location/radiation/quality/duration/timing/severity/associated sxs/prior treatment) Patient is a 56 y.o. male presenting with skin laceration. The history is provided by the patient. No language interpreter was used.  Laceration  The incident occurred 1 to 2 hours ago. The laceration is located on the left hand. The laceration is 1 cm (ans small abraded area) in size. The laceration mechanism was a a metal edge. The pain is moderate. The pain has been constant since onset. He reports no foreign bodies present. His tetanus status is UTD.   Daniel [name] is a 56 y.o. male who presents to the Emergency Department complaining of left hand laceration onset today. Pt cut his hand on machine while at work. Pt reports that he was cut by metal. Pain has been constant since onset without radiation. He has UTD tetanus. Denies any other pain.    Past Medical History  Diagnosis Date  . Vitiligo     dx in the 90s  . Diabetes mellitus 05/17/07  . Hyperlipidemia   . Knee pain     w/u included a MRI of the knee (-) and back, dx w/ Spinal Stenosis (believed to be causing knee pain)  . Normal cardiac stress test 09/2007    Past Surgical History  Procedure Date  . Finger surgery   . Shoulder surgery 09-2010    LEFT    Family History  Problem Relation Age of Onset  . Coronary artery disease Father     mid 73s  . Hypertension Mother   . Stroke Mother   . Diabetes Father   . Diabetes Mother   . Colon cancer Neg Hx   . Prostate cancer Neg Hx     History  Substance Use Topics  . Smoking status: Former Smoker -- 0.1 packs/day    Types: Cigarettes      Quit date: 05/25/2011  . Smokeless tobacco: Never Used  . Alcohol Use: Yes     rare      Review of Systems  All other systems reviewed and are negative.   10 Systems reviewed and all are negative for acute change except as noted in the HPI.   Allergies  Review of patient's allergies indicates no known allergies.  Home Medications   Current Outpatient Rx  Name Route Sig Dispense Refill  . BROMOCRIPTINE MESYLATE 2.5 MG PO TABS Oral Take 1 tablet (2.5 mg total) by mouth daily. 30 tablet 11  . GLIMEPIRIDE 1 MG PO TABS Oral Take 1 tablet (1 mg total) by mouth daily before breakfast. 30 tablet 11  . GLIMEPIRIDE 4 MG PO TABS Oral Take 2 mg by mouth daily.    Marland Kitchen METFORMIN HCL 1000 MG PO TABS Oral Take 1 tablet (1,000 mg total) by mouth 2 (two) times daily with a meal. 60 tablet 6  . ONETOUCH LANCETS MISC Does not apply by Does not apply route 4 (four) times daily.      Marland Kitchen PIOGLITAZONE HCL 30 MG PO TABS Oral Take 1 tablet (30 mg total) by mouth daily. 30 tablet 6  . PRAVASTATIN SODIUM 40 MG PO TABS Oral  Take 1 tablet (40 mg total) by mouth Nightly. 30 tablet 12  . SITAGLIPTIN PHOSPHATE 100 MG PO TABS Oral Take 1 tablet (100 mg total) by mouth daily. 30 tablet 6    BP 119/67  Pulse 58  Temp(Src) 97.5 F (36.4 C) (Oral)  Resp 16  SpO2 100%  Physical Exam  Nursing note and vitals reviewed. Constitutional: He is oriented to person, place, and time. He appears well-developed and well-nourished. No distress.  HENT:  Head: Normocephalic and atraumatic.  Eyes: EOM are normal. Pupils are equal, round, and reactive to light.  Neck: Normal range of motion. Neck supple.  Cardiovascular: Normal rate, regular rhythm and normal heart sounds.   Pulmonary/Chest: Effort normal and breath sounds normal. No respiratory distress.  Abdominal: Soft. He exhibits no distension. There is no tenderness.  Musculoskeletal: Normal range of motion.       1 puncture wound over 3rd knuckle 1 cm laceration  over 2nd knuckle   Neurological: He is alert and oriented to person, place, and time.       Left hand neurovascular intact No tendon injuries No vascular injuires Cap refill less than 2 seconds to all fingers  Skin: Skin is warm and dry.  Psychiatric: He has a normal mood and affect. His behavior is normal.    ED Course  Procedures (including critical care time) DIAGNOSTIC STUDIES: Oxygen Saturation is 100% on room air, normal by my interpretation.    COORDINATION OF CARE: 11:47PM EDP discusses pt ED treatment with pt  LACERATION REPAIR PROCEDURE NOTE The patient's identification was confirmed and consent was obtained. This procedure was performed by Caedence Snowden K Masiah Lewing-Rasch, MD at 12:45 AM. Site: left hand Sterile procedures observed yes Anesthetic used (type and amt): 2% lidocaine without epi Suture type/size:4-O proline  Length:1 cm # of Sutures: 3  Technique:simple Complexity simple Antibx ointment applied: NA Tetanus UTD or ordered UTD Site anesthetized, irrigated with NS, explored without evidence of foreign body, wound well approximated, site covered with dry, sterile dressing.  Patient tolerated procedure well without complications. Instructions for care discussed verbally and patient provided with additional written instructions for homecare and f/u.   Labs Reviewed - No data to display Dg Hand Complete Left  10/05/2011  *RADIOLOGY REPORT*  Clinical Data: Lacerations on the left side of hand.  Possible metallic foreign bodies.  LEFT HAND - COMPLETE 3+ VIEW  Comparison: None.  Findings: Mild degenerative changes in the interphalangeal joints, first metacarpal phalangeal joint, first carpometacarpal joint, and carpal joints.  No evidence of acute fracture or subluxation.  No focal bone lesion or bone destruction.  Bone cortex and trabecular architecture appear intact.  No radiopaque foreign bodies in the soft tissues.  IMPRESSION: Degenerative changes.  No radiopaque  foreign bodies or acute bony abnormalities identified.  Original Report Authenticated By: Marlon Pel, M.D.     No diagnosis found.    MDM  Return for suture removal in 10 days or sooner for fevers, drainage redness or streaking.  Patient verbalizes understanding and agrees to follow up.   I personally performed the services described in this documentation, which was scribed in my presence. The recorded information has been reviewed and considered.        Jasmine Awe, MD 10/05/11 [phone]

## 2011-10-04 NOTE — ED Notes (Signed)
Pt here for UDS and Breath Alcohol test pt does not have work badge or ID on him tried to call work RN left a message for her to call [facility] back

## 2011-10-05 NOTE — Discharge Instructions (Signed)
Laceration Care, Adult °A laceration is a cut that goes through all layers of the skin. The cut goes into the tissue beneath the skin. °HOME CARE °For stitches (sutures) or staples: °· Keep the cut clean and dry.  °· If you have a bandage (dressing), change it at least once a day. Change the bandage if it gets wet or dirty, or as told by your doctor.  °· Wash the cut with soap and water 2 times a day. Rinse the cut with water. Pat it dry with a clean towel.  °· Put a thin layer of medicated cream on the cut as told by your doctor.  °· You may shower after the first 24 hours. Do not soak the cut in water until the stitches are removed.  °· Only take medicines as told by your doctor.  °· Have your stitches or staples removed as told by your doctor.  °For skin adhesive strips: °· Keep the cut clean and dry.  °· Do not get the strips wet. You may take a bath, but be careful to keep the cut dry.  °· If the cut gets wet, pat it dry with a clean towel.  °· The strips will fall off on their own. Do not remove the strips that are still stuck to the cut.  °For wound glue: °· You may shower or take baths. Do not soak or scrub the cut. Do not swim. Avoid heavy sweating until the glue falls off on its own. After a shower or bath, pat the cut dry with a clean towel.  °· Do not put medicine on your cut until the glue falls off.  °· If you have a bandage, do not put tape over the glue.  °· Avoid lots of sunlight or tanning lamps until the glue falls off. Put sunscreen on the cut for the first year to reduce your scar.  °· The glue will fall off on its own. Do not pick at the glue.  °You may need a tetanus shot if: °· You cannot remember when you had your last tetanus shot.  °· You have never had a tetanus shot.  °If you need a tetanus shot and you choose not to have one, you may get tetanus. Sickness from tetanus can be serious. °GET HELP RIGHT AWAY IF:  °· Your pain does not get better with medicine.  °· Your arm, hand, leg, or  foot loses feeling (numbness) or changes color.  °· Your cut is bleeding.  °· Your joint feels weak, or you cannot use your joint.  °· You have painful lumps on your body.  °· Your cut is red, puffy (swollen), or painful.  °· You have a red line on the skin near the cut.  °· You have yellowish-white fluid (pus) coming from the cut.  °· You have a fever.  °· You have a bad smell coming from the cut or bandage.  °· Your cut breaks open before or after stitches are removed.  °· You notice something coming out of the cut, such as wood or glass.  °· You cannot move a finger or toe.  °MAKE SURE YOU:  °· Understand these instructions.  °· Will watch your condition.  °· Will get help right away if you are not doing well or get worse.  °Document Released: 10/27/2007 Document Revised: 04/29/2011 Document Reviewed: 11/03/2010 °Exit[employer]® Patient Information ©2012 [employer]. °

## 2011-10-05 NOTE — ED Notes (Signed)
Called the pt's work [employer] back. Per the RN pt did not need testing.

## 2011-10-05 NOTE — ED Notes (Signed)
Talked with RN from facility,Pt did not need drug testing per Facility RN d/t no ID

## 2011-10-05 NOTE — ED Notes (Signed)
Unable to do the UDS AND Breath Alcohol testing, pt does not have ID

## 2011-10-05 NOTE — ED Notes (Signed)
Dressing applied to left hand per MD order. Dressing clean, dry, and intact upon d/c.

## 2011-10-06 ENCOUNTER — Ambulatory Visit: Payer: Managed Care, Other (non HMO) | Admitting: Internal Medicine

## 2011-10-13 ENCOUNTER — Ambulatory Visit (INDEPENDENT_AMBULATORY_CARE_PROVIDER_SITE_OTHER): Payer: BC Managed Care – PPO | Admitting: Endocrinology

## 2011-10-13 ENCOUNTER — Encounter: Payer: Self-pay | Admitting: Endocrinology

## 2011-10-13 VITALS — BP 110/78 | HR 58 | Temp 98.0°F | Ht 74.0 in | Wt 231.0 lb

## 2011-10-13 DIAGNOSIS — E119 Type 2 diabetes mellitus without complications: Secondary | ICD-10-CM

## 2011-10-13 MED ORDER — BROMOCRIPTINE MESYLATE 2.5 MG PO TABS: 2.5000 mg | ORAL_TABLET | Freq: Two times a day (BID) | ORAL | Status: DC

## 2011-10-13 NOTE — Patient Instructions (Addendum)
check your blood sugar 1 time a day.  vary the time of day when you check, between before the 3 meals, and at bedtime.  also check if you have symptoms of your blood sugar being too high or too low.  please keep a record of the readings and bring it to your next appointment here.  please call us sooner if your blood sugar goes below 70, or if it stays over 150.   Please come back for a follow-up appointment in 3 months.   stop glimepiride.  increase "bromocriptine" to 1 pill 2x a day.  i have sent a prescription to your pharmacy.

## 2011-10-13 NOTE — Progress Notes (Signed)
  Subjective:    Patient ID: Daniel [name], male    DOB: 09-May-1956, 56 y.o.   MRN: [id number]  HPI Pt returns for f/u of type 2 DM (dx'ed 2010--no known complications).  no cbg record, but states cbg's are below 100.  pt states he feels well in general. Past Medical History  Diagnosis Date  . Vitiligo     dx in the 90s  . Diabetes mellitus 05/17/07  . Hyperlipidemia   . Knee pain     w/u included a MRI of the knee (-) and back, dx w/ Spinal Stenosis (believed to be causing knee pain)  . Normal cardiac stress test 09/2007    Past Surgical History  Procedure Date  . Finger surgery   . Shoulder surgery 09-2010    LEFT    History   Social History  . Marital Status: Married    Spouse Name: N/A    Number of Children: 2  . 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 Smoking status: Former Smoker -- 0.1 packs/day    Types: Cigarettes    Quit date: 05/25/2011  . Smokeless tobacco: Never Used  . Alcohol Use: Yes     rare  . Drug Use: No  . Sexually Active: Not on file   Other Topics Concern  . Not on file   Social History Narrative   From [place]----Diet:his diet is ok, wife is vegetarian----Exercise:  active at work     Current Outpatient Prescriptions on File Prior to Visit  Medication Sig Dispense Refill  . bromocriptine (PARLODEL) 2.5 MG tablet Take 1 tablet (2.5 mg total) by mouth 2 (two) times daily.  60 tablet  11  . metFORMIN (GLUCOPHAGE) 1000 MG tablet Take 1 tablet (1,000 mg total) by mouth 2 (two) times daily with a meal.  60 tablet  6  . ONE TOUCH LANCETS MISC by Does not apply route 4 (four) times daily.        . pioglitazone (ACTOS) 30 MG tablet Take 1 tablet (30 mg total) by mouth daily.  30 tablet  6  . sitaGLIPtin (JANUVIA) 100 MG tablet Take 1 tablet (100 mg total) by mouth daily.  30 tablet  6  . pravastatin (PRAVACHOL) 40 MG tablet Take 1 tablet (40 mg total) by mouth Nightly.  30 tablet  12     No Known Allergies  Family History  Problem Relation Age of Onset  . Coronary artery disease Father     mid 32s  . Hypertension Mother   . Stroke Mother   . Diabetes Father   . Diabetes Mother   . Colon cancer Neg Hx   . Prostate cancer Neg Hx     BP 110/78  Pulse 58  Temp(Src) 98 F (36.7 C) (Oral)  Ht 6\' 2"  (1.88 m)  Wt 231 lb (104.781 kg)  BMI 29.66 kg/m2  SpO2 97%  Review of Systems denies hypoglycemia    Objective:   Physical Exam VITAL SIGNS:  See vs page GENERAL: no distress Ext: no edema     Assessment & Plan:  DM.  He is ready to continue the transition off the glipizide

## 2011-10-15 ENCOUNTER — Ambulatory Visit: Payer: BC Managed Care – PPO | Admitting: Endocrinology

## 2012-01-13 ENCOUNTER — Ambulatory Visit: Payer: Self-pay | Admitting: Endocrinology

## 2012-01-13 DIAGNOSIS — Z0289 Encounter for other administrative examinations: Secondary | ICD-10-CM

## 2012-02-24 ENCOUNTER — Other Ambulatory Visit: Payer: Self-pay | Admitting: Internal Medicine

## 2012-02-24 NOTE — Telephone Encounter (Signed)
Refill done.  

## 2012-03-06 ENCOUNTER — Encounter: Payer: Self-pay | Admitting: Endocrinology

## 2012-03-06 ENCOUNTER — Ambulatory Visit (INDEPENDENT_AMBULATORY_CARE_PROVIDER_SITE_OTHER): Payer: BC Managed Care – PPO | Admitting: Endocrinology

## 2012-03-06 VITALS — BP 130/80 | HR 54 | Temp 98.0°F | Wt 218.0 lb

## 2012-03-06 DIAGNOSIS — E119 Type 2 diabetes mellitus without complications: Secondary | ICD-10-CM

## 2012-03-06 LAB — HEMOGLOBIN A1C
Hgb A1c MFr Bld: 8.4 % — ABNORMAL HIGH (ref <5.7)
Mean Plasma Glucose: 194 mg/dL — ABNORMAL HIGH (ref <117)

## 2012-03-06 NOTE — Patient Instructions (Addendum)
check your blood sugar 1 time a day.  vary the time of day when you check, between before the 3 meals, and at bedtime.  also check if you have symptoms of your blood sugar being too high or too low.  please keep a record of the readings and bring it to your next appointment here.  please call us sooner if your blood sugar goes below 70, or if it stays over 150.  Please come back for a follow-up appointment in 3 months.    blood tests are being requested for you today.  You will be contacted with results.   Based on the results, i may recommend the "victoza" pen, once a day.  The side-effect is nausea, which goes away with time.  To avoid this side-effect, start with the lowest (0.6) setting.  After a few days, increase to 1.2.  If you still have little or no nausea, increase to the highest (1.8) setting, and continue that setting.  Here is a discount card.  This medication replaces the "[place]." An alternative is to add a medication called "invokana."

## 2012-03-06 NOTE — Progress Notes (Signed)
  Subjective:    Patient ID: Daniel [name], male    DOB: 09-09-1955, 56 y.o.   MRN: [id number]  HPI Pt returns for f/u of type 2 DM (dx'ed 2010--no known complications).  no cbg record, but states cbg's are well-controlled.  pt states he feels well in general.   Past Medical History  Diagnosis Date  . Vitiligo     dx in the 90s  . Diabetes mellitus 05/17/07  . Hyperlipidemia   . Knee pain     w/u included a MRI of the knee (-) and back, dx w/ Spinal Stenosis (believed to be causing knee pain)  . Normal cardiac stress test 09/2007    Past Surgical History  Procedure Date  . Finger surgery   . Shoulder surgery 09-2010    LEFT    History   Social History  . Marital Status: Married    Spouse Name: N/A    Number of Children: 2  . 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 Smoking status: Former Smoker -- 0.1 packs/day    Types: Cigarettes    Quit date: 05/25/2011  . Smokeless tobacco: Never Used  . Alcohol Use: Yes     rare  . Drug Use: No  . Sexually Active: Not on file   Other Topics Concern  . Not on file   Social History Narrative   From [place]----Diet:his diet is ok, wife is vegetarian----Exercise:  active at work     Current Outpatient Prescriptions on File Prior to Visit  Medication Sig Dispense Refill  . bromocriptine (PARLODEL) 2.5 MG tablet Take 1 tablet (2.5 mg total) by mouth 2 (two) times daily.  60 tablet  11  . metFORMIN (GLUCOPHAGE) 1000 MG tablet TAKE 1 TABLET BY MOUTH TWICE DAILY WITH A MEAL  60 tablet  0  . ONE TOUCH LANCETS MISC by Does not apply route 4 (four) times daily.        . pioglitazone (ACTOS) 30 MG tablet Take 1 tablet (30 mg total) by mouth daily.  30 tablet  6  . pravastatin (PRAVACHOL) 40 MG tablet Take 1 tablet (40 mg total) by mouth Nightly.  30 tablet  12  . sitaGLIPtin (JANUVIA) 100 MG tablet Take 1 tablet (100 mg total) by mouth daily.  30 tablet  6    No Known  Allergies  Family History  Problem Relation Age of Onset  . Coronary artery disease Father     mid 39s  . Hypertension Mother   . Stroke Mother   . Diabetes Father   . Diabetes Mother   . Colon cancer Neg Hx   . Prostate cancer Neg Hx    BP 130/80  Pulse 54  Temp 98 F (36.7 C) (Oral)  Wt 218 lb (98.884 kg)  Review of Systems He has lost a few lbs, die to his efforts.     Objective:   Physical Exam VITAL SIGNS:  See vs page.  GENERAL: no distress. Pulses: dorsalis pedis intact bilat.   Feet: no deformity.  no ulcer on the feet.  feet are of normal color and temp.  no edema.   Neuro: sensation is intact to touch on the feet.    Lab Results  Component Value Date   HGBA1C 8.4* 03/06/2012      Assessment & Plan:  DM: needs increased rx

## 2012-03-07 ENCOUNTER — Telehealth: Payer: Self-pay | Admitting: General Practice

## 2012-03-07 LAB — MICROALBUMIN / CREATININE URINE RATIO
Creatinine, Urine: 173.7 mg/dL
Microalb Creat Ratio: 9.9 mg/g (ref 0.0–30.0)

## 2012-03-07 NOTE — Telephone Encounter (Signed)
i did ref to DM education

## 2012-03-07 NOTE — Telephone Encounter (Signed)
Pt wife called stating her concern over Pt's Diabetes. She states that the husband has a major sweet tooth and does not take her concerns for following diet seriously. Also states that She would like provider to serioulsy discuss the risks with Diabetes at the one month follow up appointment and possibly recommend a nutritionist or diabetic counselor.

## 2012-03-07 NOTE — Telephone Encounter (Signed)
Pt wife notified of the referral.

## 2012-03-09 ENCOUNTER — Ambulatory Visit: Payer: Self-pay | Admitting: *Deleted

## 2012-03-21 ENCOUNTER — Encounter: Payer: Self-pay | Admitting: Dietician

## 2012-03-21 ENCOUNTER — Encounter: Payer: BC Managed Care – PPO | Attending: Endocrinology | Admitting: Dietician

## 2012-03-21 VITALS — Ht 74.0 in | Wt 219.1 lb

## 2012-03-21 DIAGNOSIS — E119 Type 2 diabetes mellitus without complications: Secondary | ICD-10-CM | POA: Insufficient documentation

## 2012-03-21 DIAGNOSIS — Z713 Dietary counseling and surveillance: Secondary | ICD-10-CM | POA: Insufficient documentation

## 2012-03-21 NOTE — Patient Instructions (Addendum)
   When your feel really tired, check your blood sugar.    Check after the evening meal (2 hours after the first bite) goal is 80-160   Try to keep the fat at 3 gm or less of fat per serving of the deli meats.  Aim for keeping the carbohydrate level for meals at 60 gm of (6o carb bucks for meals) and snacks at 15-30 gm.   Always have a lean protein serving with meals and a protein with the snacks.  Keep fat at 2-3 added fats at each meal (mayo, sour cream, cream cheese, salad dressings) use the food label for the serving size.  During the times that you Tinsley Everman be off from work for 3-5 days.  Plan to get some physical activity.  Consider water exercise or swimming due to the pain in your back and knee.  Check with your insurance, perhaps they would assist with paying the fee for the [facility] or facility that has a pool.  Check your blood glucose more often.  The A1C is an average of about 197.  At sometime during the day the glucose is high.  On the food label, look to have fiber at 2 gm per slice of bread and 3+ gm for cereals and cereal bars.  On the food label, look to have sugar content at (0-9 gm) per serving.  F/U if there are questions or if the glucose levels are not changing.  [phone].  I enjoyed working with you two this afternoon.  Let me know if I can be of further assistance.

## 2012-03-21 NOTE — Progress Notes (Signed)
Medical Nutrition Therapy:  Appt start time: 1515 end time:  1630.   Assessment:  Primary concerns today: Wants to learn how to eat.  Has a history of diabetes for 5 years. His last A1C was at 8.6 % (Avg of about 197 mg).  With the recent level it was suggested he stop the Januvia and start taking Victoza.  He has chosen to continue the Januvia and not start the Victoza.  His wife is with him today and while she does the cooking and prepares meals for him to carry to work, she is distressed that at times he will make food purchases from the vending machine or at [employer] that is sweet and contains a lot of fat.  She is about supporting him in a more carb restrictive eating pattern.  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  He works 12 hour shifts for 6 days per week.  He works the 7 PM to 7 Am shift. From his description, the work is physical and at times stressful and challenging. Stress Uday Jantz well be playing part in his high glucose levels.  BLOOD GLUCOSE MONITORING: Monitoring 3-4 times per week.  Usually fasting level in the afternoon after sleeping during the day of in the morning when he arrives home.  Most of the times, his glucose is in the 100-110 range at those times.  HYPERGLYCEMIA:  Today gives no S/S of high glucose levels, but notes a few months ago, he had the tiredness, the hunger and the thirst.  HYPOGLYCEMIA:  Today gives no S/S of low blood glucose levels, but in the past when he does not have an adequate amount of carb with his lunch meal at work, he would have the symptoms of low blood sugar.  He would eat fruit to help bring the glucose level up.     MEDICATIONS: Current diabetes medications include:Bromocriptine, Metformin, Pioglitazone, and Januvia   DIETARY INTAKE:  Usual eating pattern includes 3 meals and 1-2 snacks per day.  24-hr recall:  B ( PM): 5:00 PM starts his day.  Chicken 7-8 oz or beef, breast, with shredded breast with  onion,  pepper and tomato 1.5 cups that served with rice 1/2 cup if beans (pinto, fried) 1 cup.  Drink the diet coke.   Snk ( PM):none L ( PM): 10:00-11:00  Will take the food for dinner for his lunch with an apple or banana or whole wheat bagle with PB, sometimes almonds Snk ( PM): 12:00-1:00 has ww bagel with the PB with coffee.  OR cheese [place] with the coffee. D ( AM): 7:30-8:00 AM scrambled eggs 1-2 with hash brown or beef sausage (small amt.) [place] have eggs with tomato pepper mushsrooms, onions, coffee. Snk ( PM): none Beverages: coffee, diet soda, water.   Usual physical activity: No regular scheduled exercise regimen.  Works a physically active job.  Estimated energy needs:HT: 74 in  WT: 219.1 lb  BMI: 28.6 kg/m2   Adj WT: 200 lb (90 kg) 1800 calories 200-205 g carbohydrates 130-135 g protein 48-50 g fat  Progress Towards Goal(s):  In progress.   Nutritional Diagnosis:  [place]-2.1 Inpaired nutrition utilization As related to blood glucose.  As evidenced by history of type 2 diabetes for 5 years, A1C at 8.6%, and use of 4 medications for glucose control.    Intervention:  Nutrition Review of the physiology of diabetes and the effects on the body of elevated blood glucose levels. Recommended limiting his  carb intake.  Read food labels for counting carbs,  Look to have increased fiber in foods and limit the sugar content in each serving to (0-9 gm/serving).  Recommended attempting to get some physical activity on his day off.  Look to use more fruits and monitor portions for increasing the sweet in his diet.  Recommended aiming for CHO at 60 gm for meals and 15-30 gm for snacks and to have a lean protein or protein at all meals and snacks.  Handouts given during visit include:  Living Well with Diabetes in Spanish   Carb Counting by [employer] in Spanish  Controlling blood glucose  Yellow card with diet prescription  Monitoring/Evaluation:  Dietary intake, exercise, blood glucose  , and body weight to call with questions or for follow-up appointment.

## 2012-03-22 ENCOUNTER — Other Ambulatory Visit: Payer: Self-pay | Admitting: Internal Medicine

## 2012-03-22 NOTE — Telephone Encounter (Signed)
done

## 2012-03-22 NOTE — Telephone Encounter (Signed)
Pt has not been seen within a year. OK to refill? 

## 2012-04-06 ENCOUNTER — Other Ambulatory Visit: Payer: Self-pay | Admitting: Internal Medicine

## 2012-04-06 NOTE — Telephone Encounter (Signed)
Refill done.  

## 2012-04-11 ENCOUNTER — Ambulatory Visit: Payer: Self-pay | Admitting: Endocrinology

## 2012-04-28 ENCOUNTER — Other Ambulatory Visit: Payer: Self-pay | Admitting: Internal Medicine

## 2012-04-28 NOTE — Telephone Encounter (Signed)
Refill done.  

## 2012-06-09 ENCOUNTER — Ambulatory Visit (INDEPENDENT_AMBULATORY_CARE_PROVIDER_SITE_OTHER): Payer: BC Managed Care – PPO | Admitting: Internal Medicine

## 2012-06-09 VITALS — BP 128/72 | HR 68 | Temp 98.2°F | Wt 217.0 lb

## 2012-06-09 DIAGNOSIS — B349 Viral infection, unspecified: Secondary | ICD-10-CM

## 2012-06-09 DIAGNOSIS — E119 Type 2 diabetes mellitus without complications: Secondary | ICD-10-CM

## 2012-06-09 DIAGNOSIS — E785 Hyperlipidemia, unspecified: Secondary | ICD-10-CM

## 2012-06-09 DIAGNOSIS — B9789 Other viral agents as the cause of diseases classified elsewhere: Secondary | ICD-10-CM

## 2012-06-09 MED ORDER — PROMETHAZINE HCL 12.5 MG PO TABS: 12.5000 mg | ORAL_TABLET | Freq: Three times a day (TID) | ORAL | Status: DC | PRN

## 2012-06-09 MED ORDER — HYDROCODONE-HOMATROPINE 5-1.5 MG/5ML PO SYRP: 5.0000 mL | ORAL_SOLUTION | Freq: Four times a day (QID) | ORAL | Status: DC | PRN

## 2012-06-09 NOTE — Progress Notes (Signed)
  Subjective:    Patient ID: Daniel [name], male    DOB: 1956-01-30, 57 y.o.   MRN: [id number]  HPI Acute visit Got sick 5 days ago: Developed cough, mostly dry. Cough is getting better He also had nausea, worse with solid intake, tolerating fluids okay. Vomited twice. Overall nausea has decreased slightly. Also has a headache, moderate, mostly in the posterior head. Has myalgias but no arthralgias, mostly at the upper body. He takes ibuprofen as needed with good results.  Past Medical History  Diagnosis Date  . Vitiligo     dx in the 90s  . Diabetes mellitus 05/17/07  . Hyperlipidemia   . Knee pain     w/u included a MRI of the knee (-) and back, dx w/ Spinal Stenosis (believed to be causing knee pain)  . Normal cardiac stress test 09/2007   Past Surgical History  Procedure Date  . Finger surgery   . Shoulder surgery 09-2010    LEFT     Review of Systems + Subjective fever, chills and sweats. Denies diarrhea, abdominal pain. No rash anywhere. No sick contacts. Had mild sinus congestion, it is getting better. He did not have a flu shot this year    Objective:   Physical Exam General -- alert, well-developed, and well-nourished. Nontoxic appearing.   HEENT -- TMs normal, throat w/o redness, face symmetric and not tender to palpation, nose is slightly congested  Lungs -- normal respiratory effort, no intercostal retractions, no accessory muscle use, and normal breath sounds.   Heart-- normal rate, regular rhythm, no murmur, and no gallop.   Abdomen--soft, non-tender, no distention, no masses, no HSM, no guarding, and no rigidity.   Extremities-- no pretibial edema bilaterally Psych-- Cognition and judgment appear intact. Alert and cooperative with normal attention span and concentration.  not anxious appearing and not depressed appearing.       Assessment & Plan:  Viral syndrome, Symptoms consistent with a viral syndrome, influenza? Strep test negative Overall, patient  has improved slightly in the last couple of days, will treat symptomatically with hydrocodone and Phenergan. See instructions.

## 2012-06-09 NOTE — Assessment & Plan Note (Signed)
Not taking any cholesterol medication for a while

## 2012-06-09 NOTE — Assessment & Plan Note (Signed)
Despite acute illness, reports that his CBGs remained around 140

## 2012-06-09 NOTE — Patient Instructions (Addendum)
Rest, fluids , tylenol For cough, take Mucinex DM twice a day as needed  If the cough continue you have aches and pains, take hydrocodone liquid, will make his For nausea, take promethazine as needed. Will make you sleepy. Call if no better in few days Call anytime if the symptoms are severe  --- Please call Dr. George Hugh office and arrange a   followup with him Please schedule a physical exam with me within 3 months

## 2012-06-11 ENCOUNTER — Encounter: Payer: Self-pay | Admitting: Internal Medicine

## 2012-07-26 ENCOUNTER — Ambulatory Visit (INDEPENDENT_AMBULATORY_CARE_PROVIDER_SITE_OTHER): Payer: BC Managed Care – PPO | Admitting: Endocrinology

## 2012-07-26 VITALS — BP 132/78 | HR 58 | Temp 98.8°F | Wt 219.0 lb

## 2012-07-26 DIAGNOSIS — E119 Type 2 diabetes mellitus without complications: Secondary | ICD-10-CM

## 2012-07-26 NOTE — Progress Notes (Signed)
Subjective:    Patient ID: Daniel [name], male    DOB: Jun 04, 1955, 57 y.o.   MRN: [id number]  HPI Pt returns for f/u of type 2 DM (dx'ed 2010--no known complications).  no cbg record, but states cbg's are well-controlled.  pt states he feels well in general.  He did not change the [place] to victoza.   Past Medical History  Diagnosis Date  . Vitiligo     dx in the 90s  . Diabetes mellitus 05/17/07  . Hyperlipidemia   . Knee pain     w/u included a MRI of the knee (-) and back, dx w/ Spinal Stenosis (believed to be causing knee pain)  . Normal cardiac stress test 09/2007    Past Surgical History  Procedure Laterality Date  . Finger surgery    . Shoulder surgery  09-2010    LEFT    History   Social History  . Marital Status: Married    Spouse Name: N/A    Number of Children: 2  . 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 Smoking status: Former Smoker -- 0.10 packs/day    Types: Cigarettes    Quit date: 05/25/2011  . Smokeless tobacco: Never Used  . Alcohol Use: Yes     Comment: rare  . Drug Use: No  . Sexually Active: Not on file   Other Topics Concern  . Not on file   Social History Narrative   From [place]----   Diet:his diet is ok, wife is vegetarian----   Exercise:  active at work     Current Outpatient Prescriptions on File Prior to Visit  Medication Sig Dispense Refill  . bromocriptine (PARLODEL) 2.5 MG tablet Take 1 tablet (2.5 mg total) by mouth 2 (two) times daily.  60 tablet  11  . HYDROcodone-homatropine (HYCODAN) 5-1.5 MG/5ML syrup Take 5 mLs by mouth every 6 (six) hours as needed for cough.  120 mL  0  . metFORMIN (GLUCOPHAGE) 1000 MG tablet TAKE 1 TABLET BY MOUTH TWICE DAILY WITH A MEAL  60 tablet  6  . ONE TOUCH LANCETS MISC by Does not apply route 4 (four) times daily.        . pioglitazone (ACTOS) 30 MG tablet Take 1 tablet (30 mg total) by mouth daily.  30 tablet  6  . promethazine  (PHENERGAN) 12.5 MG tablet Take 1 tablet (12.5 mg total) by mouth every 8 (eight) hours as needed for nausea.  12 tablet  0  . sitaGLIPtin (JANUVIA) 100 MG tablet Take 1 tablet (100 mg total) by mouth daily.  30 tablet  6   No current facility-administered medications on file prior to visit.    No Known Allergies  Family History  Problem Relation Age of Onset  . Coronary artery disease Father     mid 34s  . Hypertension Mother   . Stroke Mother   . Diabetes Father   . Diabetes Mother   . Colon cancer Neg Hx   . Prostate cancer Neg Hx     BP 132/78  Pulse 58  Temp(Src) 98.8 F (37.1 C) (Oral)  Wt 219 lb (99.338 kg)  BMI 28.11 kg/m2  SpO2 98%   Review of Systems Denies weight change.    Objective:   Physical Exam VITAL SIGNS:  See vs page GENERAL: no distress Pulses: dorsalis pedis intact bilat.   Feet: no deformity.  no ulcer on  the feet.  feet are of normal color and temp.  no edema.   Neuro: sensation is intact to touch on the feet.   Lab Results  Component Value Date   HGBA1C 8.4* 07/26/2012      Assessment & Plan:  DM: needs increased rx

## 2012-07-26 NOTE — Patient Instructions (Addendum)
check your blood sugar 1 time a day.  vary the time of day when you check, between before the 3 meals, and at bedtime.  also check if you have symptoms of your blood sugar being too high or too low.  please keep a record of the readings and bring it to your next appointment here.  please call us sooner if your blood sugar goes below 70, or if it stays over 150.  Please come back for a follow-up appointment in 3 months.    blood tests are being requested for you today.  You will be contacted with results.   If your blood test is high, we'll add a medication called "invokana."  Please drink plenty of fluids while on this.

## 2012-07-27 MED ORDER — CANAGLIFLOZIN 300 MG PO TABS: 1.0000 | ORAL_TABLET | Freq: Every day | ORAL | Status: DC

## 2012-08-01 ENCOUNTER — Telehealth: Payer: Self-pay | Admitting: Endocrinology

## 2012-08-01 NOTE — Telephone Encounter (Signed)
Pt's wife called requesting results from pt's last visit a week ago. Please advise.

## 2012-08-01 NOTE — Telephone Encounter (Signed)
Pt advised of results and states an understanding 

## 2012-08-01 NOTE — Telephone Encounter (Signed)
Blood sugar is high. i have sent a prescription to your pharmacy, to add "invokana." i'll see you next time.

## 2012-08-01 NOTE — Telephone Encounter (Signed)
Patient's wife called to request test results from patient's visit a week ago.  Please call [phone] with results.

## 2012-08-05 ENCOUNTER — Other Ambulatory Visit: Payer: Self-pay | Admitting: Internal Medicine

## 2012-08-07 NOTE — Telephone Encounter (Signed)
wil forward to endocrinology

## 2012-08-07 NOTE — Telephone Encounter (Signed)
Pt has not been seen within a year. OK to refill? 

## 2012-09-05 ENCOUNTER — Other Ambulatory Visit: Payer: Self-pay | Admitting: Endocrinology

## 2012-09-05 ENCOUNTER — Other Ambulatory Visit: Payer: Self-pay | Admitting: *Deleted

## 2012-09-05 MED ORDER — SITAGLIPTIN PHOSPHATE 100 MG PO TABS: 100.0000 mg | ORAL_TABLET | Freq: Every day | ORAL | Status: DC

## 2012-10-23 ENCOUNTER — Ambulatory Visit (INDEPENDENT_AMBULATORY_CARE_PROVIDER_SITE_OTHER): Payer: BC Managed Care – PPO | Admitting: Endocrinology

## 2012-10-23 ENCOUNTER — Encounter: Payer: Self-pay | Admitting: Endocrinology

## 2012-10-23 VITALS — BP 126/80 | HR 80 | Ht 74.0 in | Wt 207.0 lb

## 2012-10-23 DIAGNOSIS — E119 Type 2 diabetes mellitus without complications: Secondary | ICD-10-CM

## 2012-10-23 MED ORDER — PIOGLITAZONE HCL 30 MG PO TABS: 30.0000 mg | ORAL_TABLET | Freq: Every day | ORAL | Status: DC

## 2012-10-23 NOTE — Progress Notes (Signed)
Subjective:    Patient ID: Daniel [name], male    DOB: 1955-11-12, 57 y.o.   MRN: [id number]  HPI Pt returns for f/u of type 2 DM (dx'ed 2010; he has mild if any neuropathy of the lower extremities; he has no known associated complications).  no cbg record, but states cbg's are well-controlled.  pt states he feels well in general.  He ran out of actos a few mos ago.   Past Medical History  Diagnosis Date  . Vitiligo     dx in the 90s  . Diabetes mellitus 05/17/07  . Hyperlipidemia   . Knee pain     w/u included a MRI of the knee (-) and back, dx w/ Spinal Stenosis (believed to be causing knee pain)  . Normal cardiac stress test 09/2007    Past Surgical History  Procedure Laterality Date  . Finger surgery    . Shoulder surgery  09-2010    LEFT    History   Social History  . Marital Status: Married    Spouse Name: N/A    Number of Children: 2  . 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 Smoking status: Former Smoker -- 0.10 packs/day    Types: Cigarettes    Quit date: 05/25/2011  . Smokeless tobacco: Never Used  . Alcohol Use: Yes     Comment: rare  . Drug Use: No  . Sexually Active: Not on file   Other Topics Concern  . Not on file   Social History Narrative   From [place]----   Diet:his diet is ok, wife is vegetarian----   Exercise:  active at work     Current Outpatient Prescriptions on File Prior to Visit  Medication Sig Dispense Refill  . Canagliflozin (INVOKANA) 300 MG TABS Take 1 tablet by mouth daily.  30 tablet  11  . metFORMIN (GLUCOPHAGE) 1000 MG tablet TAKE 1 TABLET BY MOUTH TWICE DAILY WITH A MEAL  60 tablet  6  . ONE TOUCH LANCETS MISC by Does not apply route 4 (four) times daily.        . sitaGLIPtin (JANUVIA) 100 MG tablet Take 1 tablet (100 mg total) by mouth daily.  30 tablet  2  . bromocriptine (PARLODEL) 2.5 MG tablet Take 1 tablet (2.5 mg total) by mouth 2 (two) times daily.  60  tablet  11   No current facility-administered medications on file prior to visit.    No Known Allergies  Family History  Problem Relation Age of Onset  . Coronary artery disease Father     mid 6s  . Hypertension Mother   . Stroke Mother   . Diabetes Father   . Diabetes Mother   . Colon cancer Neg Hx   . Prostate cancer Neg Hx    BP 126/80  Pulse 80  Ht 6\' 2"  (1.88 m)  Wt 207 lb (93.895 kg)  BMI 26.57 kg/m2  SpO2 98%  Review of Systems He has lost a few lbs.  He denies sob    Objective:   Physical Exam VITAL SIGNS:  See vs page GENERAL: no distress.   Lab Results  Component Value Date   HGBA1C 7.6* 10/23/2012      Assessment & Plan:  DM: a1c is high due to being off the actos.  We discussed the nine oral agents available for type 2 diabetes.  This regimen of oral agents gives  the best risk-benefit ratio, due to the avoidance of hypoglycemia.

## 2012-10-23 NOTE — Patient Instructions (Signed)
check your blood sugar 1 time a day.  vary the time of day when you check, between before the 3 meals, and at bedtime.  also check if you have symptoms of your blood sugar being too high or too low.  please keep a record of the readings and bring it to your next appointment here.  please call us sooner if your blood sugar goes below 70, or if it stays over 150.  Please come back for a follow-up appointment in 3 months.    blood tests are being requested for you today.  You will be contacted with results.   If your blood test is high, we'll add a medication called "invokana."  Please drink plenty of fluids while on this.

## 2012-10-26 ENCOUNTER — Other Ambulatory Visit: Payer: Self-pay | Admitting: Endocrinology

## 2012-11-11 ENCOUNTER — Other Ambulatory Visit: Payer: Self-pay | Admitting: Internal Medicine

## 2012-11-28 ENCOUNTER — Other Ambulatory Visit: Payer: Self-pay | Admitting: *Deleted

## 2012-11-28 MED ORDER — BROMOCRIPTINE MESYLATE 2.5 MG PO TABS: 2.5000 mg | ORAL_TABLET | Freq: Two times a day (BID) | ORAL | Status: DC

## 2012-12-08 ENCOUNTER — Other Ambulatory Visit: Payer: Self-pay | Admitting: Endocrinology

## 2013-01-08 ENCOUNTER — Other Ambulatory Visit: Payer: Self-pay | Admitting: Endocrinology

## 2013-01-23 ENCOUNTER — Encounter: Payer: Self-pay | Admitting: Endocrinology

## 2013-01-23 ENCOUNTER — Ambulatory Visit (INDEPENDENT_AMBULATORY_CARE_PROVIDER_SITE_OTHER): Payer: BC Managed Care – PPO | Admitting: Endocrinology

## 2013-01-23 VITALS — BP 130/78 | HR 60 | Ht 72.0 in | Wt 205.0 lb

## 2013-01-23 DIAGNOSIS — E119 Type 2 diabetes mellitus without complications: Secondary | ICD-10-CM

## 2013-01-23 MED ORDER — CIPROFLOXACIN HCL 500 MG PO TABS: 500.0000 mg | ORAL_TABLET | Freq: Two times a day (BID) | ORAL | Status: DC

## 2013-01-23 MED ORDER — ONDANSETRON HCL 4 MG PO TABS: 4.0000 mg | ORAL_TABLET | Freq: Three times a day (TID) | ORAL | Status: DC | PRN

## 2013-01-23 NOTE — Progress Notes (Signed)
Subjective:    Patient ID: Daniel [name], male    DOB: [birth year], 57 y.o.   MRN: [id number]  HPI Pt returns for f/u of type 2 DM (dx'ed 2010; he has mild if any neuropathy of the lower extremities; he has no known associated complications).  no cbg record, but states cbg's are well-controlled.  pt states he feels well in general.   He will travel to [place] city soon.   Past Medical History  Diagnosis Date  . Vitiligo     dx in the 90s  . Diabetes mellitus 05/17/07  . Hyperlipidemia   . Knee pain     w/u included a MRI of the knee (-) and back, dx w/ Spinal Stenosis (believed to be causing knee pain)  . Normal cardiac stress test 09/2007    Past Surgical History  Procedure Laterality Date  . Finger surgery    . Shoulder surgery  09-2010    LEFT    History   Social History  . Marital Status: Married    Spouse Name: N/A    Number of Children: 2  . 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 Smoking status: Former Smoker -- 0.10 packs/day    Types: Cigarettes    Quit date: 05/25/2011  . Smokeless tobacco: Never Used  . Alcohol Use: Yes     Comment: rare  . Drug Use: No  . Sexual Activity: Not on file   Other Topics Concern  . Not on file   Social History Narrative   From [place]----   Diet:his diet is ok, wife is vegetarian----   Exercise:  active at work     Current Outpatient Prescriptions on File Prior to Visit  Medication Sig Dispense Refill  . bromocriptine (PARLODEL) 2.5 MG tablet Take 1 tablet (2.5 mg total) by mouth 2 (two) times daily.  60 tablet  4  . Canagliflozin (INVOKANA) 300 MG TABS Take 1 tablet by mouth daily.  30 tablet  11  . JANUVIA 100 MG tablet TAKE 1 TABLET BY MOUTH EVERY DAY  30 tablet  0  . metFORMIN (GLUCOPHAGE) 1000 MG tablet TAKE 1 TABLET BY MOUTH TWICE DAILY WITH FOOD  60 tablet  5  . ONE TOUCH LANCETS MISC by Does not apply route 4 (four) times daily.        . pioglitazone  (ACTOS) 30 MG tablet Take 1 tablet (30 mg total) by mouth daily.  30 tablet  11   No current facility-administered medications on file prior to visit.    No Known Allergies  Family History  Problem Relation Age of Onset  . Coronary artery disease Father     mid 29s  . Hypertension Mother   . Stroke Mother   . Diabetes Father   . Diabetes Mother   . Colon cancer Neg Hx   . Prostate cancer Neg Hx    BP 130/78  Pulse 60  Ht 6' (1.829 m)  Wt 205 lb (92.987 kg)  BMI 27.8 kg/m2  SpO2 98%  Review of Systems denies hypoglycemia and weight change.     Objective:   Physical Exam VITAL SIGNS:  See vs page GENERAL: no distress Ext: no edema.    Lab Results  Component Value Date   HGBA1C 7.3* 01/23/2013      Assessment & Plan:  DM: We discussed the nine oral agents available for type 2 diabetes.  This  regimen gives the best risk-benefit ratio.  He needs increased rx. Upcoming travel prophylaxis is addressed today.

## 2013-01-23 NOTE — Patient Instructions (Addendum)
check your blood sugar 1 time a day.  vary the time of day when you check, between before the 3 meals, and at bedtime.  also check if you have symptoms of your blood sugar being too high or too low.  please keep a record of the readings and bring it to your next appointment here.  please call us sooner if your blood sugar goes below 70, or if it stays over 150.   Please come back for a follow-up appointment in 3 months.   blood tests are being requested for you today.  we'll contact you with results.   i have sent 2 prescriptions to your pharmacy, for your upcoming trip.  The cipro is if you get diarrhea, and the other is as needed for nausea.

## 2013-01-24 LAB — HEMOGLOBIN A1C: Hgb A1c MFr Bld: 7.3 % — ABNORMAL HIGH (ref 4.6–6.5)

## 2013-01-24 MED ORDER — NATEGLINIDE 60 MG PO TABS: 60.0000 mg | ORAL_TABLET | Freq: Three times a day (TID) | ORAL | Status: DC

## 2013-02-08 ENCOUNTER — Other Ambulatory Visit: Payer: Self-pay | Admitting: Endocrinology

## 2013-03-07 ENCOUNTER — Other Ambulatory Visit: Payer: Self-pay | Admitting: Endocrinology

## 2013-04-16 ENCOUNTER — Other Ambulatory Visit: Payer: Self-pay | Admitting: Endocrinology

## 2013-04-25 ENCOUNTER — Ambulatory Visit: Payer: Self-pay | Admitting: Endocrinology

## 2013-04-30 ENCOUNTER — Encounter: Payer: Self-pay | Admitting: Endocrinology

## 2013-04-30 ENCOUNTER — Ambulatory Visit (INDEPENDENT_AMBULATORY_CARE_PROVIDER_SITE_OTHER): Payer: BC Managed Care – PPO | Admitting: Endocrinology

## 2013-04-30 VITALS — BP 118/80 | HR 67 | Temp 97.8°F | Ht 72.0 in | Wt 201.0 lb

## 2013-04-30 DIAGNOSIS — E119 Type 2 diabetes mellitus without complications: Secondary | ICD-10-CM

## 2013-04-30 DIAGNOSIS — Z23 Encounter for immunization: Secondary | ICD-10-CM

## 2013-04-30 DIAGNOSIS — L80 Vitiligo: Secondary | ICD-10-CM

## 2013-04-30 NOTE — Patient Instructions (Signed)
check your blood sugar 1 time a day.  vary the time of day when you check, between before the 3 meals, and at bedtime.  also check if you have symptoms of your blood sugar being too high or too low.  please keep a record of the readings and bring it to your next appointment here.  please call us sooner if your blood sugar goes below 70, or if it stays over 150.    Please come back for a follow-up appointment in 3 months.   blood tests are being requested for you today.  we'll contact you with results.   Based on the results, you may need the "nateglinide."  i'll let you know.

## 2013-04-30 NOTE — Progress Notes (Signed)
Subjective:    Patient ID: Daniel [name], male    DOB: 11/02/1955, 57 y.o.   MRN: [id number]  HPI Pt returns for f/u of type 2 DM (dx'ed 2010, when he presented with blurry vision (glucose was 500); he has mild if any neuropathy of the lower extremities; he has no known associated complications; he has never taken insulin; he has vitiligo, so it is expected that he will evolve type 1 DM).  no cbg record, but states cbg's are well-controlled.  He did not take the starlix.  pt states he feels well in general.   Past Medical History  Diagnosis Date  . Vitiligo     dx in the 90s  . Diabetes mellitus 05/17/07  . Hyperlipidemia   . Knee pain     w/u included a MRI of the knee (-) and back, dx w/ Spinal Stenosis (believed to be causing knee pain)  . Normal cardiac stress test 09/2007    Past Surgical History  Procedure Laterality Date  . Finger surgery    . Shoulder surgery  09-2010    LEFT    History   Social History  . Marital Status: Married    Spouse Name: N/A    Number of Children: 2  . 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 Smoking status: Former Smoker -- 0.10 packs/day    Types: Cigarettes    Quit date: 05/25/2011  . Smokeless tobacco: Never Used  . Alcohol Use: Yes     Comment: rare  . Drug Use: No  . Sexual Activity: Not on file   Other Topics Concern  . Not on file   Social History Narrative   From [place]----   Diet:his diet is ok, wife is vegetarian----   Exercise:  active at work     Current Outpatient Prescriptions on File Prior to Visit  Medication Sig Dispense Refill  . bromocriptine (PARLODEL) 2.5 MG tablet Take 1 tablet (2.5 mg total) by mouth 2 (two) times daily.  60 tablet  4  . Canagliflozin (INVOKANA) 300 MG TABS Take 1 tablet by mouth daily.  30 tablet  11  . ciprofloxacin (CIPRO) 500 MG tablet Take 1 tablet (500 mg total) by mouth 2 (two) times daily. Take if you get diarrhea  14  tablet  0  . JANUVIA 100 MG tablet TAKE 1 TABLET BY MOUTH EVERY DAY  30 tablet  2  . metFORMIN (GLUCOPHAGE) 1000 MG tablet TAKE 1 TABLET BY MOUTH TWICE DAILY WITH FOOD  60 tablet  5  . ondansetron (ZOFRAN) 4 MG tablet Take 1 tablet (4 mg total) by mouth every 8 (eight) hours as needed for nausea.  20 tablet  0  . ONE TOUCH LANCETS MISC by Does not apply route 4 (four) times daily.        . pioglitazone (ACTOS) 30 MG tablet Take 1 tablet (30 mg total) by mouth daily.  30 tablet  11   No current facility-administered medications on file prior to visit.    No Known Allergies  Family History  Problem Relation Age of Onset  . Coronary artery disease Father     mid 54s  . Hypertension Mother   . Stroke Mother   . Diabetes Father   . Diabetes Mother   . Colon cancer Neg Hx   . Prostate cancer Neg Hx     BP 118/80  Pulse 67  Temp(Src) 97.8 F (36.6 C) (Oral)  Ht 6' (1.829 m)  Wt 201 lb (91.173 kg)  BMI 27.25 kg/m2  SpO2 97%  Review of Systems He has lost a few lbs.  Denies numbness    Objective:   Physical Exam VITAL SIGNS:  See vs page GENERAL: no distress  Lab Results  Component Value Date   HGBA1C 7.6* 04/30/2013      Assessment & Plan:  DM: he needs increased rx, if it can be done with a regimen that avoids or minimizes hypoglycemia. Weight loss: this helps the control of DM. Vitiligo: this means he will likely evolve type 1 DM over time.

## 2013-05-01 LAB — HEMOGLOBIN A1C: Hgb A1c MFr Bld: 7.6 % — ABNORMAL HIGH (ref 4.6–6.5)

## 2013-05-01 MED ORDER — NATEGLINIDE 60 MG PO TABS: 60.0000 mg | ORAL_TABLET | Freq: Three times a day (TID) | ORAL | Status: DC

## 2013-05-20 ENCOUNTER — Other Ambulatory Visit: Payer: Self-pay | Admitting: Endocrinology

## 2013-05-31 ENCOUNTER — Other Ambulatory Visit: Payer: Self-pay | Admitting: Internal Medicine

## 2013-06-22 ENCOUNTER — Other Ambulatory Visit: Payer: Self-pay | Admitting: Endocrinology

## 2013-07-01 ENCOUNTER — Other Ambulatory Visit: Payer: Self-pay | Admitting: Internal Medicine

## 2013-07-16 ENCOUNTER — Other Ambulatory Visit: Payer: Self-pay | Admitting: Endocrinology

## 2013-07-25 ENCOUNTER — Other Ambulatory Visit: Payer: Self-pay | Admitting: Endocrinology

## 2013-08-03 ENCOUNTER — Other Ambulatory Visit: Payer: Self-pay | Admitting: Internal Medicine

## 2013-08-08 ENCOUNTER — Other Ambulatory Visit: Payer: Self-pay | Admitting: Endocrinology

## 2013-08-14 ENCOUNTER — Other Ambulatory Visit: Payer: Self-pay | Admitting: Endocrinology

## 2013-08-20 ENCOUNTER — Telehealth: Payer: Self-pay | Admitting: Endocrinology

## 2013-08-20 MED ORDER — LINAGLIPTIN 5 MG PO TABS: 5.0000 mg | ORAL_TABLET | Freq: Every day | ORAL | Status: DC

## 2013-08-20 NOTE — Telephone Encounter (Signed)
Noted, pt informed via voicemail and requested to call back if he had any questions.

## 2013-08-20 NOTE — Telephone Encounter (Signed)
please call patient: Insurance wants you to change to an alternative to [place].  i have sent a prescription to your pharmacy, to "tradjenta."

## 2013-08-23 ENCOUNTER — Other Ambulatory Visit: Payer: Self-pay | Admitting: Endocrinology

## 2013-08-28 ENCOUNTER — Other Ambulatory Visit: Payer: Self-pay | Admitting: Endocrinology

## 2013-09-06 ENCOUNTER — Other Ambulatory Visit: Payer: Self-pay | Admitting: Internal Medicine

## 2013-09-11 ENCOUNTER — Other Ambulatory Visit: Payer: Self-pay | Admitting: Endocrinology

## 2013-09-18 ENCOUNTER — Other Ambulatory Visit: Payer: Self-pay | Admitting: Endocrinology

## 2013-09-25 ENCOUNTER — Other Ambulatory Visit: Payer: Self-pay | Admitting: Endocrinology

## 2013-10-12 ENCOUNTER — Other Ambulatory Visit: Payer: Self-pay | Admitting: Endocrinology

## 2013-10-12 ENCOUNTER — Other Ambulatory Visit: Payer: Self-pay | Admitting: Internal Medicine

## 2013-10-21 ENCOUNTER — Other Ambulatory Visit: Payer: Self-pay | Admitting: Endocrinology

## 2013-10-23 ENCOUNTER — Other Ambulatory Visit: Payer: Self-pay | Admitting: Endocrinology

## 2013-11-05 ENCOUNTER — Other Ambulatory Visit: Payer: Self-pay | Admitting: Endocrinology

## 2013-11-12 ENCOUNTER — Encounter: Payer: Self-pay | Admitting: Internal Medicine

## 2013-11-12 ENCOUNTER — Ambulatory Visit (INDEPENDENT_AMBULATORY_CARE_PROVIDER_SITE_OTHER): Payer: 59 | Admitting: Internal Medicine

## 2013-11-12 VITALS — BP 105/69 | HR 60 | Temp 98.3°F | Wt 194.0 lb

## 2013-11-12 DIAGNOSIS — R222 Localized swelling, mass and lump, trunk: Secondary | ICD-10-CM

## 2013-11-12 NOTE — Progress Notes (Signed)
Pre visit review using our clinic review tool, if applicable. No additional management support is needed unless otherwise documented below in the visit note. 

## 2013-11-12 NOTE — Patient Instructions (Signed)
Please get your x-ray at the other [place]  office located at: [address], across from [facility].  Please go to the basement, this is a walk-in facility, they are open from 8:30 to 5:30 PM. Phone number [phone].      Next visit is for a physical exam at your earliest convenience ,  fasting Please make an appointment     At some point this year the clinic will relocate to  [facility],  [address] (10 minutes form here)  [address]  [place], [place] [address] [phone]

## 2013-11-12 NOTE — Progress Notes (Signed)
Subjective:    Patient ID: Daniel [name], male    DOB: [birth year], 58 y.o.   MRN: [id number]  DOS:  11/12/2013 Type of  Visit: Acute History: A couple of weeks become aware of a " mass" at the right side of the abdomen. No injury, no pain.    ROS Denies any nausea, vomiting or diarrhea. No abdominal pain. No rash. Good compliance with medications. due to see endocrinology  Past Medical History  Diagnosis Date  . Vitiligo     dx in the 90s  . Diabetes mellitus 05/17/07  . Hyperlipidemia   . Knee pain     w/u included a MRI of the knee (-) and back, dx w/ Spinal Stenosis (believed to be causing knee pain)  . Normal cardiac stress test 09/2007    Past Surgical History  Procedure Laterality Date  . Finger surgery    . Shoulder surgery  09-2010    LEFT    History   Social History  . Marital Status: Married    Spouse Name: N/A    Number of Children: 2  . 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 Smoking status: Former Smoker -- 0.10 packs/day    Types: Cigarettes    Quit date: 05/25/2011  . Smokeless tobacco: Never Used  . Alcohol Use: Yes     Comment: rare  . Drug Use: No  . Sexual Activity: Not on file   Other Topics Concern  . Not on file   Social History Narrative   From [place]----         Medication List       This list is accurate as of: 11/12/13  5:38 PM.  Always use your most recent med list.               bromocriptine 2.5 MG tablet  Commonly known as:  PARLODEL  TAKE 1 TABLET BY MOUTH TWICE DAILY     INVOKANA 300 MG Tabs  Generic drug:  Canagliflozin  TAKE 1 TABLET BY MOUTH EVERY DAY     JANUVIA 100 MG tablet  Generic drug:  sitaGLIPtin  TAKE 1 TABLET BY MOUTH EVERY DAY     metFORMIN 1000 MG tablet  Commonly known as:  GLUCOPHAGE  TAKE 1 TABLET BY MOUTH TWICE DAILY WITH FOOD NEEDS OFFICE VISIT     nateglinide 60 MG tablet  Commonly known as:  STARLIX  Take 1  tablet (60 mg total) by mouth 3 (three) times daily before meals.     ondansetron 4 MG tablet  Commonly known as:  ZOFRAN  Take 1 tablet (4 mg total) by mouth every 8 (eight) hours as needed for nausea.     ONE TOUCH LANCETS Misc  by Does not apply route 4 (four) times daily.     pioglitazone 30 MG tablet  Commonly known as:  ACTOS  TAKE 1 TABLET BY MOUTH DAILY           Objective:   Physical Exam BP 105/69  Pulse 60  Temp(Src) 98.3 F (36.8 C)  Wt 194 lb (87.998 kg)  SpO2 97% General -- alert, well-developed, NAD.  Healthy appearing. Lungs -- normal respiratory effort, no intercostal retractions, no accessory muscle use, and normal breath sounds. Chest is asymmetric. See assessment and plan Abdomen-- Not distended, good bowel sounds,soft, non-tender. No rebound or rigidity. No mass,organomegaly. Extremities-- no pretibial edema bilaterally  Neurologic--  alert & oriented X3. Speech normal, gait appropriate for age, strength symmetric and appropriate for age.   Psych-- Cognition and judgment appear intact. Cooperative with normal attention span and concentration. No anxious or depressed appearing.        Assessment & Plan:  "Chest mass" The area of patient's concern is the distal  right rib cage, when he stands up the chest wall is asymmetric and it  look like a mass but I don't think it is. He has lost some weight and the rib cage border is more prominent. Recommend observation, to reassured the patient and be sure there is not bony abnormality will do a  x-ray  He is overdue for a physical, see instructions

## 2013-11-14 ENCOUNTER — Telehealth: Payer: Self-pay | Admitting: *Deleted

## 2013-11-14 ENCOUNTER — Ambulatory Visit (INDEPENDENT_AMBULATORY_CARE_PROVIDER_SITE_OTHER)
Admission: RE | Admit: 2013-11-14 | Discharge: 2013-11-14 | Disposition: A | Discharge status: Home / Self Care | Payer: 59 | Source: Ambulatory Visit | Attending: Internal Medicine | Admitting: Internal Medicine

## 2013-11-14 DIAGNOSIS — R222 Localized swelling, mass and lump, trunk: Secondary | ICD-10-CM

## 2013-11-14 NOTE — Telephone Encounter (Signed)
Spoke with pts wife regarding xray results. Pts wife would like to know whats causing the lump to his side if xrays are okay.

## 2013-11-14 NOTE — Telephone Encounter (Signed)
Message copied by Eustace QuailEABOLD, Kinzlee Selvy J on Wed Nov 14, 2013  5:09 PM ------      Message from: Willow OraPAZ, JOSE E      Created: Wed Nov 14, 2013  2:34 PM       Advise patient, x-ray is okay ------

## 2013-11-15 ENCOUNTER — Other Ambulatory Visit: Payer: Self-pay | Admitting: Internal Medicine

## 2013-11-15 NOTE — Telephone Encounter (Signed)
If we have permission to talk with his wife:  advised her that he really does not have a lump rather a prominent rib cage noticeable because he lost weight.

## 2013-11-15 NOTE — Telephone Encounter (Signed)
Done . pts wife notified.

## 2013-11-23 ENCOUNTER — Other Ambulatory Visit: Payer: Self-pay | Admitting: Endocrinology

## 2013-12-01 ENCOUNTER — Other Ambulatory Visit: Payer: Self-pay | Admitting: Endocrinology

## 2013-12-08 ENCOUNTER — Other Ambulatory Visit: Payer: Self-pay | Admitting: Endocrinology

## 2013-12-10 ENCOUNTER — Other Ambulatory Visit: Payer: Self-pay | Admitting: Endocrinology

## 2013-12-14 ENCOUNTER — Other Ambulatory Visit: Payer: Self-pay | Admitting: Endocrinology

## 2013-12-25 ENCOUNTER — Other Ambulatory Visit: Payer: Self-pay | Admitting: Endocrinology

## 2014-01-11 ENCOUNTER — Other Ambulatory Visit: Payer: Self-pay | Admitting: Endocrinology

## 2014-01-13 ENCOUNTER — Other Ambulatory Visit: Payer: Self-pay | Admitting: Endocrinology

## 2014-01-20 ENCOUNTER — Other Ambulatory Visit: Payer: Self-pay | Admitting: Endocrinology

## 2014-01-21 ENCOUNTER — Telehealth: Payer: Self-pay

## 2014-01-21 NOTE — Telephone Encounter (Signed)
Left message with wife to schedule appt with Dr. Everardo All for diabetes maintenance.   DM bundle pt.

## 2014-01-25 ENCOUNTER — Encounter: Payer: Self-pay | Admitting: Endocrinology

## 2014-01-25 ENCOUNTER — Ambulatory Visit (INDEPENDENT_AMBULATORY_CARE_PROVIDER_SITE_OTHER): Payer: 59 | Admitting: Endocrinology

## 2014-01-25 VITALS — BP 122/60 | HR 60 | Temp 97.9°F | Ht 72.0 in | Wt 200.0 lb

## 2014-01-25 DIAGNOSIS — Z23 Encounter for immunization: Secondary | ICD-10-CM

## 2014-01-25 DIAGNOSIS — E119 Type 2 diabetes mellitus without complications: Secondary | ICD-10-CM

## 2014-01-25 LAB — BASIC METABOLIC PANEL
BUN: 18 mg/dL (ref 6–23)
CALCIUM: 9.2 mg/dL (ref 8.4–10.5)
CO2: 23 meq/L (ref 19–32)
CREATININE: 0.7 mg/dL (ref 0.4–1.5)
Chloride: 106 mEq/L (ref 96–112)
GFR: 124.13 mL/min (ref >60.00)
Glucose, Bld: 129 mg/dL — ABNORMAL HIGH (ref 70–99)
Potassium: 3.9 mEq/L (ref 3.5–5.1)
Sodium: 137 mEq/L (ref 135–145)

## 2014-01-25 LAB — TSH: TSH: 0.31 u[IU]/mL — ABNORMAL LOW (ref 0.35–4.50)

## 2014-01-25 LAB — LIPID PANEL
Cholesterol: 168 mg/dL (ref 0–200)
HDL: 58.4 mg/dL (ref >39.00)
LDL Cholesterol: 94 mg/dL (ref 0–99)
NONHDL: 109.6
Total CHOL/HDL Ratio: 3
Triglycerides: 76 mg/dL (ref 0.0–149.0)
VLDL: 15.2 mg/dL (ref 0.0–40.0)

## 2014-01-25 LAB — MICROALBUMIN / CREATININE URINE RATIO
Creatinine,U: 59.9 mg/dL
MICROALB UR: 0.2 mg/dL (ref 0.0–1.9)
MICROALB/CREAT RATIO: 0.3 mg/g (ref 0.0–30.0)

## 2014-01-25 LAB — HEMOGLOBIN A1C: Hgb A1c MFr Bld: 7.6 % — ABNORMAL HIGH (ref 4.6–6.5)

## 2014-01-25 NOTE — Patient Instructions (Addendum)
check your blood sugar 1 time a day.  vary the time of day when you check, between before the 3 meals, and at bedtime.  also check if you have symptoms of your blood sugar being too high or too low.  please keep a record of the readings and bring it to your next appointment here.  please call us sooner if your blood sugar goes below 70, or if it stays over 150.    Please come back for a follow-up appointment in 3 months.   blood tests are being requested for you today.  we'll contact you with results.   With time, your diabetes will get to the point where you need insulin.  It is easy to take, and practically painless, so don't worry about that.

## 2014-01-25 NOTE — Progress Notes (Signed)
Subjective:    Patient ID: Daniel [name], male    DOB: 1955/08/12, 58 y.o.   MRN: [id number]  HPI Pt returns for f/u of type 2 DM (dx'ed 2010, when he presented with blurry vision (glucose was 500); no chronic complications; he has never taken insulin; he has vitiligo, so it is expected that he will evolve type 1 DM; he takes 6 oral DM meds).  no cbg record, but states cbg's are well-controlled.  pt states he feels well in general.  He says he has trouble remembering the starlix.   Past Medical History  Diagnosis Date  . Vitiligo     dx in the 90s  . Diabetes mellitus 05/17/07  . Hyperlipidemia   . Knee pain     w/u included a MRI of the knee (-) and back, dx w/ Spinal Stenosis (believed to be causing knee pain)  . Normal cardiac stress test 09/2007    Past Surgical History  Procedure Laterality Date  . Finger surgery    . Shoulder surgery  09-2010    LEFT    History   Social History  . Marital Status: Married    Spouse Name: N/A    Number of Children: 2  . 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 Smoking status: Former Smoker -- 0.10 packs/day    Types: Cigarettes    Quit date: 05/25/2011  . Smokeless tobacco: Never Used  . Alcohol Use: Yes     Comment: rare  . Drug Use: No  . Sexual Activity: Not on file   Other Topics Concern  . Not on file   Social History Narrative   From [place]----     Current Outpatient Prescriptions on File Prior to Visit  Medication Sig Dispense Refill  . bromocriptine (PARLODEL) 2.5 MG tablet TAKE 1 TABLET BY MOUTH TWICE DAILY  60 tablet  0  . INVOKANA 300 MG TABS TAKE 1 TABLET BY MOUTH EVERY DAY  30 tablet  0  . JANUVIA 100 MG tablet TAKE 1 TABLET BY MOUTH EVERY DAY  30 tablet  0  . metFORMIN (GLUCOPHAGE) 1000 MG tablet TAKE 1 TABLET BY MOUTH TWICE DAILY WITH FOOD  60 tablet  6  . ondansetron (ZOFRAN) 4 MG tablet Take 1 tablet (4 mg total) by mouth every 8 (eight) hours  as needed for nausea.  20 tablet  0  . ONE TOUCH LANCETS MISC by Does not apply route 4 (four) times daily.        . pioglitazone (ACTOS) 30 MG tablet TAKE 1 TABLET BY MOUTH EVERY DAY  30 tablet  1   No current facility-administered medications on file prior to visit.    No Known Allergies  Family History  Problem Relation Age of Onset  . Coronary artery disease Father     mid 34s  . Hypertension Mother   . Stroke Mother   . Diabetes Father   . Diabetes Mother   . Colon cancer Neg Hx   . Prostate cancer Neg Hx     BP 122/60  Pulse 60  Temp(Src) 97.9 F (36.6 C) (Oral)  Ht 6' (1.829 m)  Wt 200 lb (90.719 kg)  BMI 27.12 kg/m2  SpO2 96%    Review of Systems He denies hypoglycemia and weight change    Objective:   Physical Exam VITAL SIGNS:  See vs page GENERAL: no distress Pulses: dorsalis pedis  intact bilat.   Feet: no deformity. normal color and temp.  no edema Skin:  no ulcer on the feet.  Vitiligo on the hands. Neuro: sensation is intact to touch on the feet   Lab Results  Component Value Date   HGBA1C 7.6* 01/25/2014   Lab Results  Component Value Date   CHOL 168 01/25/2014   HDL 58.40 01/25/2014   LDLCALC 94 01/25/2014   TRIG 76.0 01/25/2014   CHOLHDL 3 01/25/2014   Lab Results  Component Value Date   TSH 0.31* 01/25/2014       Assessment & Plan:  Hyperthyroidism, mild, new.  We'll recheck this in the future DM: mild exacerbation Dyslipidemia: well-controlled Noncompliance with cbg recording and medication taking: he is advised to try to remember.  Patient is advised the following: Patient Instructions  check your blood sugar 1 time a day.  vary the time of day when you check, between before the 3 meals, and at bedtime.  also check if you have symptoms of your blood sugar being too high or too low.  please keep a record of the readings and bring it to your next appointment here.  please call us sooner if your blood sugar goes below 70, or if it stays over  150.    Please come back for a follow-up appointment in 3 months.   blood tests are being requested for you today.  we'll contact you with results.   With time, your diabetes will get to the point where you need insulin.  It is easy to take, and practically painless, so don't worry about that.   change the nateglinide to "repaglinide,"

## 2014-01-26 MED ORDER — REPAGLINIDE 2 MG PO TABS: 2.0000 mg | ORAL_TABLET | Freq: Three times a day (TID) | ORAL | Status: DC

## 2014-01-29 ENCOUNTER — Other Ambulatory Visit: Payer: Self-pay | Admitting: Endocrinology

## 2014-02-04 ENCOUNTER — Other Ambulatory Visit: Payer: Self-pay | Admitting: Endocrinology

## 2014-02-15 ENCOUNTER — Other Ambulatory Visit: Payer: Self-pay | Admitting: Endocrinology

## 2014-02-17 ENCOUNTER — Other Ambulatory Visit: Payer: Self-pay | Admitting: Endocrinology

## 2014-03-06 ENCOUNTER — Other Ambulatory Visit: Payer: Self-pay | Admitting: Endocrinology

## 2014-03-16 ENCOUNTER — Other Ambulatory Visit: Payer: Self-pay | Admitting: Endocrinology

## 2014-03-21 ENCOUNTER — Other Ambulatory Visit: Payer: Self-pay | Admitting: Endocrinology

## 2014-04-11 ENCOUNTER — Other Ambulatory Visit: Payer: Self-pay | Admitting: Endocrinology

## 2014-04-15 ENCOUNTER — Telehealth: Payer: Self-pay | Admitting: Endocrinology

## 2014-04-15 ENCOUNTER — Other Ambulatory Visit: Payer: Self-pay | Admitting: Endocrinology

## 2014-04-15 MED ORDER — SAXAGLIPTIN HCL 5 MG PO TABS: 5.0000 mg | ORAL_TABLET | Freq: Every day | ORAL | Status: DC

## 2014-04-15 NOTE — Telephone Encounter (Signed)
please call patient: Ins wants you to change januvia to onglyza (very similar medication). i have sent a prescription to your pharmacy i'll see you next time.

## 2014-04-15 NOTE — Telephone Encounter (Signed)
Unable to reach pt will try again at a later time.  

## 2014-04-16 NOTE — Telephone Encounter (Signed)
Unable to reach pt will try again at a later time.  

## 2014-04-17 NOTE — Telephone Encounter (Signed)
Pt's wife advised of note below and voiced understanding.  

## 2014-04-22 ENCOUNTER — Other Ambulatory Visit: Payer: Self-pay | Admitting: Endocrinology

## 2014-04-23 ENCOUNTER — Other Ambulatory Visit: Payer: Self-pay | Admitting: Endocrinology

## 2014-05-21 ENCOUNTER — Other Ambulatory Visit: Payer: Self-pay | Admitting: Endocrinology

## 2014-05-30 ENCOUNTER — Other Ambulatory Visit: Payer: Self-pay | Admitting: Endocrinology

## 2014-06-18 ENCOUNTER — Ambulatory Visit (INDEPENDENT_AMBULATORY_CARE_PROVIDER_SITE_OTHER): Payer: 59 | Admitting: Endocrinology

## 2014-06-18 ENCOUNTER — Other Ambulatory Visit: Payer: Self-pay | Admitting: Endocrinology

## 2014-06-18 ENCOUNTER — Encounter: Payer: Self-pay | Admitting: Endocrinology

## 2014-06-18 ENCOUNTER — Other Ambulatory Visit: Payer: Self-pay | Admitting: Internal Medicine

## 2014-06-18 VITALS — BP 114/72 | HR 62 | Temp 97.5°F | Wt 200.0 lb

## 2014-06-18 DIAGNOSIS — E119 Type 2 diabetes mellitus without complications: Secondary | ICD-10-CM

## 2014-06-18 LAB — HEMOGLOBIN A1C: HEMOGLOBIN A1C: 7.9 % — AB (ref 4.6–6.5)

## 2014-06-18 MED ORDER — COLESEVELAM HCL 625 MG PO TABS: 1875.0000 mg | ORAL_TABLET | Freq: Every day | ORAL | Status: DC

## 2014-06-18 MED ORDER — BROMOCRIPTINE MESYLATE 2.5 MG PO TABS: 2.5000 mg | ORAL_TABLET | Freq: Two times a day (BID) | ORAL | Status: DC

## 2014-06-18 MED ORDER — SAXAGLIPTIN HCL 5 MG PO TABS: 5.0000 mg | ORAL_TABLET | Freq: Every day | ORAL | Status: DC

## 2014-06-18 MED ORDER — PIOGLITAZONE HCL 30 MG PO TABS: 30.0000 mg | ORAL_TABLET | Freq: Every day | ORAL | Status: DC

## 2014-06-18 MED ORDER — CANAGLIFLOZIN 300 MG PO TABS: 300.0000 mg | ORAL_TABLET | Freq: Every day | ORAL | Status: DC

## 2014-06-18 MED ORDER — METFORMIN HCL 1000 MG PO TABS: 1000.0000 mg | ORAL_TABLET | Freq: Two times a day (BID) | ORAL | Status: DC

## 2014-06-18 NOTE — Patient Instructions (Addendum)
check your blood sugar 1 time a day.  vary the time of day when you check, between before the 3 meals, and at bedtime.  also check if you have symptoms of your blood sugar being too high or too low.  please keep a record of the readings and bring it to your next appointment here.  please call us sooner if your blood sugar goes below 70, or if it stays over 150.    Please come back for a follow-up appointment in 3 months.   blood tests are being requested for you today.  we'll contact you with results.    With time, your diabetes will get to the point where you need insulin.  It is easy to take, and practically painless, so don't worry about that.  Please see a foot specialist.  you will receive a phone call, about a day and time for an appointment

## 2014-06-18 NOTE — Progress Notes (Signed)
Subjective:    Patient ID: Daniel [name], male    DOB: [birth year], 59 y.o.   MRN: [id number]  HPI  Pt returns for f/u of diabetes mellitus: DM type: 2 Dx'ed: 2010 Complications:  Therapy: 6 oral meds DKA: never Severe hypoglycemia: never Pancreatitis: never Other: he has never taken insulin; he has vitiligo, so it is expected that he will evolve type 1 DM Interval history: no cbg record, but states cbg's are well-controlled.  Pt states 1 month of slight pain at the plantar aspect of the left foot, but no assoc numbness. Past Medical History  Diagnosis Date  . Vitiligo     dx in the 90s  . Diabetes mellitus 05/17/07  . Hyperlipidemia   . Knee pain     w/u included a MRI of the knee (-) and back, dx w/ Spinal Stenosis (believed to be causing knee pain)  . Normal cardiac stress test 09/2007    Past Surgical History  Procedure Laterality Date  . Finger surgery    . Shoulder surgery  09-2010    LEFT    History   Social History  . Marital Status: Married    Spouse Name: N/A    Number of Children: 2  . 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 Smoking status: Former Smoker -- 0.10 packs/day    Types: Cigarettes    Quit date: 05/25/2011  . Smokeless tobacco: Never Used  . Alcohol Use: Yes     Comment: rare  . Drug Use: No  . Sexual Activity: Not on file   Other Topics Concern  . Not on file   Social History Narrative   From [place]----     Current Outpatient Prescriptions on File Prior to Visit  Medication Sig Dispense Refill  . ondansetron (ZOFRAN) 4 MG tablet Take 1 tablet (4 mg total) by mouth every 8 (eight) hours as needed for nausea. 20 tablet 0  . ONE TOUCH LANCETS MISC by Does not apply route 4 (four) times daily.      . repaglinide (PRANDIN) 2 MG tablet Take 1 tablet (2 mg total) by mouth 3 (three) times daily before meals. 90 tablet 11   No current facility-administered medications on file  prior to visit.    No Known Allergies  Family History  Problem Relation Age of Onset  . Coronary artery disease Father     mid 4380s  . Hypertension Mother   . Stroke Mother   . Diabetes Father   . Diabetes Mother   . Colon cancer Neg Hx   . Prostate cancer Neg Hx     BP 114/72 mmHg  Pulse 62  Temp(Src) 97.5 F (36.4 C) (Oral)  Wt 200 lb (90.719 kg)  SpO2 97%    Review of Systems He denies foot ulcer and hypoglycemia.     Objective:   Physical Exam VITAL SIGNS:  See vs page GENERAL: no distress Pulses: dorsalis pedis intact bilat.   MSK: no deformity of the feet CV: no leg edema Skin:  no ulcer on the feet.  normal color and temp on the feet. Neuro: sensation is intact to touch on the feet Ext: at the plantar aspect of the left foot, under the 5th metatarsal head, there is a 1 cm heavy callus. Lab Results  Component Value Date   HGBA1C 7.9* 06/18/2014      Assessment & Plan:  Foot pain,  new, uncertain etiology. DM: mild exacerbation.   Patient is advised the following: Patient Instructions  check your blood sugar 1 time a day.  vary the time of day when you check, between before the 3 meals, and at bedtime.  also check if you have symptoms of your blood sugar being too high or too low.  please keep a record of the readings and bring it to your next appointment here.  please call us sooner if your blood sugar goes below 70, or if it stays over 150.    Please come back for a follow-up appointment in 3 months.   blood tests are being requested for you today.  we'll contact you with results.    With time, your diabetes will get to the point where you need insulin.  It is easy to take, and practically painless, so don't worry about that.  Please see a foot specialist.  you will receive a phone call, about a day and time for an appointment   addendum: add "welchol."

## 2014-06-25 ENCOUNTER — Ambulatory Visit (INDEPENDENT_AMBULATORY_CARE_PROVIDER_SITE_OTHER): Payer: 59

## 2014-06-25 ENCOUNTER — Ambulatory Visit (INDEPENDENT_AMBULATORY_CARE_PROVIDER_SITE_OTHER): Payer: 59 | Admitting: Podiatry

## 2014-06-25 VITALS — BP 97/55 | HR 65 | Resp 18 | Ht 74.0 in | Wt 200.0 lb

## 2014-06-25 DIAGNOSIS — M7752 Other enthesopathy of left foot: Secondary | ICD-10-CM

## 2014-06-25 DIAGNOSIS — M779 Enthesopathy, unspecified: Secondary | ICD-10-CM

## 2014-06-25 DIAGNOSIS — M778 Other enthesopathies, not elsewhere classified: Secondary | ICD-10-CM

## 2014-06-25 DIAGNOSIS — E119 Type 2 diabetes mellitus without complications: Secondary | ICD-10-CM

## 2014-06-25 DIAGNOSIS — R52 Pain, unspecified: Secondary | ICD-10-CM

## 2014-06-25 DIAGNOSIS — Q828 Other specified congenital malformations of skin: Secondary | ICD-10-CM

## 2014-06-25 NOTE — Patient Instructions (Signed)
Diabetes and Foot Care Diabetes may cause you to have problems because of poor blood supply (circulation) to your feet and legs. This may cause the skin on your feet to become thinner, break easier, and heal more slowly. Your skin may become dry, and the skin may peel and crack. You may also have nerve damage in your legs and feet causing decreased feeling in them. You may not notice minor injuries to your feet that could lead to infections or more serious problems. Taking care of your feet is one of the most important things you can do for yourself.  HOME CARE INSTRUCTIONS  Wear shoes at all times, even in the house. Do not go barefoot. Bare feet are easily injured.  Check your feet daily for blisters, cuts, and redness. If you cannot see the bottom of your feet, use a mirror or ask someone for help.  Wash your feet with warm water (do not use hot water) and mild soap. Then pat your feet and the areas between your toes until they are completely dry. Do not soak your feet as this can dry your skin.  Apply a moisturizing lotion or petroleum jelly (that does not contain alcohol and is unscented) to the skin on your feet and to dry, brittle toenails. Do not apply lotion between your toes.  Trim your toenails straight across. Do not dig under them or around the cuticle. File the edges of your nails with an emery board or nail file.  Do not cut corns or calluses or try to remove them with medicine.  Wear clean socks or stockings every day. Make sure they are not too tight. Do not wear knee-high stockings since they may decrease blood flow to your legs.  Wear shoes that fit properly and have enough cushioning. To break in new shoes, wear them for just a few hours a day. This prevents you from injuring your feet. Always look in your shoes before you put them on to be sure there are no objects inside.  Do not cross your legs. This may decrease the blood flow to your feet.  If you find a minor scrape,  cut, or break in the skin on your feet, keep it and the skin around it clean and dry. These areas may be cleansed with mild soap and water. Do not cleanse the area with peroxide, alcohol, or iodine.  When you remove an adhesive bandage, be sure not to damage the skin around it.  If you have a wound, look at it several times a day to make sure it is healing.  Do not use heating pads or hot water bottles. They may burn your skin. If you have lost feeling in your feet or legs, you may not know it is happening until it is too late.  Make sure your health care provider performs a complete foot exam at least annually or more often if you have foot problems. Report any cuts, sores, or bruises to your health care provider immediately. SEEK MEDICAL CARE IF:   You have an injury that is not healing.  You have cuts or breaks in the skin.  You have an ingrown nail.  You notice redness on your legs or feet.  You feel burning or tingling in your legs or feet.  You have pain or cramps in your legs and feet.  Your legs or feet are numb.  Your feet always feel cold. SEEK IMMEDIATE MEDICAL CARE IF:   There is increasing redness,   swelling, or pain in or around a wound.  There is a red line that goes up your leg.  Pus is coming from a wound.  You develop a fever or as directed by your health care provider.  You notice a bad smell coming from an ulcer or wound. Document Released: 05/07/2000 Document Revised: 01/10/2013 Document Reviewed: 10/17/2012 Exit[employer] Patient Information 2015 [employer]. This information is not intended to replace advice given to you by your health care provider. Make sure you discuss any questions you have with your health care provider.  

## 2014-06-25 NOTE — Progress Notes (Signed)
   Subjective:    Patient ID: Daniel [name], male    DOB: Oct 24, 1955, 59 y.o.   MRN: [id number]  HPI Comments: Painful spot on the bottom of the left foot, 5th metatarsal porokeratosis . Painful after being on it for a long period of time . Diabetic last a1c was 7.9   Foot Pain      Review of Systems  Endocrine:       Diabetic   All other systems reviewed and are negative.      Objective:   Physical Exam: I have reviewed his past medical history medications allergies surgery social history and review of systems. Pulses are strongly palpable bilateral. Neurologic sensorium is intact per Semmes-Weinstein monofilament. Deep tendon reflexes are intact bilaterally muscle strength is 5 over 5 dorsiflexion plantar flexors inverters and everters all into the musculature is intact. Orthopedic evaluation demonstrates pain on palpation sub-fifth metatarsophalangeal joint of the left foot. A reactive hyperkeratosis is there as well but does not appear to be erythematous or inflamed.        Assessment & Plan:  Assessment: Capsulitis sub-fifth metatarsophalangeal joint of the left foot possible neuritis. Porokeratosis.  Plan: Discussed etiology pathology conservative versus surgical therapies. I injected the area today with dexamethasone and local anesthetic into the plantar aspect of the joint. I also debrided the reactive hyperkeratosis. Should this recur he will notify us and we may need to consider getting him a pair of orthotics molded.

## 2014-07-11 ENCOUNTER — Other Ambulatory Visit: Payer: Self-pay | Admitting: Endocrinology

## 2014-07-24 ENCOUNTER — Other Ambulatory Visit: Payer: Self-pay | Admitting: Endocrinology

## 2014-09-17 ENCOUNTER — Encounter: Payer: Self-pay | Admitting: Endocrinology

## 2014-09-17 ENCOUNTER — Ambulatory Visit (INDEPENDENT_AMBULATORY_CARE_PROVIDER_SITE_OTHER): Payer: 59 | Admitting: Endocrinology

## 2014-09-17 VITALS — BP 104/68 | HR 66 | Temp 97.7°F | Ht 74.0 in | Wt 195.0 lb

## 2014-09-17 DIAGNOSIS — E119 Type 2 diabetes mellitus without complications: Secondary | ICD-10-CM

## 2014-09-17 LAB — HEMOGLOBIN A1C: Hgb A1c MFr Bld: 7.3 % — ABNORMAL HIGH (ref 4.6–6.5)

## 2014-09-17 NOTE — Patient Instructions (Signed)
check your blood sugar 1 time a day.  vary the time of day when you check, between before the 3 meals, and at bedtime.  also check if you have symptoms of your blood sugar being too high or too low.  please keep a record of the readings and bring it to your next appointment here.  please call us sooner if your blood sugar goes below 70, or if it stays over 150.    Please come back for a follow-up appointment in 3 months.   blood tests are being requested for you today.  we'll contact you with results.    With time, your diabetes will get to the point where you need insulin.  It is easy to take, and practically painless, so don't worry about that.  Please see Cristy FolksLinda Spagnola, RN, today, to learn about injection insulin, in case you need to.

## 2014-09-17 NOTE — Progress Notes (Signed)
Subjective:    Patient ID: Daniel [name], male    DOB: January 08, 1956, 59 y.o.   MRN: [id number]  HPI Pt returns for f/u of diabetes mellitus: DM type: 2 Dx'ed: 2010 Complications:  Therapy: 6 oral meds DKA: never Severe hypoglycemia: never Pancreatitis: never Other: he has never taken insulin; he has vitiligo, so it is expected that he will evolve type 1 DM Interval history: no cbg record, but states cbg's are well-controlled. He takes meds as rx'ed.  Past Medical History  Diagnosis Date  . Vitiligo     dx in the 90s  . Diabetes mellitus 05/17/07  . Hyperlipidemia   . Knee pain     w/u included a MRI of the knee (-) and back, dx w/ Spinal Stenosis (believed to be causing knee pain)  . Normal cardiac stress test 09/2007    Past Surgical History  Procedure Laterality Date  . Finger surgery    . Shoulder surgery  09-2010    LEFT    History   Social History  . Marital Status: Married    Spouse Name: N/A  . Number of Children: 2  . 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 Smoking status: Former Smoker -- 0.10 packs/day    Types: Cigarettes    Quit date: 05/25/2011  . Smokeless tobacco: Never Used  . Alcohol Use: Yes     Comment: rare  . Drug Use: No  . Sexual Activity: Not on file   Other Topics Concern  . Not on file   Social History Narrative   From [place]----     Current Outpatient Prescriptions on File Prior to Visit  Medication Sig Dispense Refill  . bromocriptine (PARLODEL) 2.5 MG tablet Take 1 tablet (2.5 mg total) by mouth 2 (two) times daily. 60 tablet 11  . canagliflozin (INVOKANA) 300 MG TABS tablet Take 300 mg by mouth daily. 30 tablet 11  . colesevelam (WELCHOL) 625 MG tablet Take 3 tablets (1,875 mg total) by mouth daily with breakfast. 90 tablet 11  . INVOKANA 300 MG TABS tablet TAKE 1 TABLET BY MOUTH EVERY DAY 30 tablet 0  . metFORMIN (GLUCOPHAGE) 1000 MG tablet Take 1 tablet  (1,000 mg total) by mouth 2 (two) times daily with a meal. DUE FOR APPT WITH DR. PAZ. [phone] 60 tablet 2  . ondansetron (ZOFRAN) 4 MG tablet Take 1 tablet (4 mg total) by mouth every 8 (eight) hours as needed for nausea. 20 tablet 0  . ONE TOUCH LANCETS MISC by Does not apply route 4 (four) times daily.      . pioglitazone (ACTOS) 30 MG tablet Take 1 tablet (30 mg total) by mouth daily. 30 tablet 11  . repaglinide (PRANDIN) 2 MG tablet Take 1 tablet (2 mg total) by mouth 3 (three) times daily before meals. 90 tablet 11  . saxagliptin HCl (ONGLYZA) 5 MG TABS tablet Take 1 tablet (5 mg total) by mouth daily. 30 tablet 11   No current facility-administered medications on file prior to visit.    No Known Allergies  Family History  Problem Relation Age of Onset  . Coronary artery disease Father     mid 14s  . Hypertension Mother   . Stroke Mother   . Diabetes Father   . Diabetes Mother   . Colon cancer Neg Hx   . Prostate cancer Neg Hx     BP 104/68 mmHg  Pulse 66  Temp(Src) 97.7 F (36.5 C) (Oral)  Ht 6\' 2"  (1.88 m)  Wt 195 lb (88.451 kg)  BMI 25.03 kg/m2  SpO2 94%    Review of Systems He denies hypoglycemia.      Objective:   Physical Exam VITAL SIGNS:  See vs page GENERAL: no distress Pulses: dorsalis pedis intact bilat.   MSK: no deformity of the feet CV: no leg edema Skin:  no ulcer on the feet.  normal color and temp on the feet. Neuro: sensation is intact to touch on the feet.    Lab Results  Component Value Date   HGBA1C 7.3* 09/17/2014    (I discussed with Cristy FolksLinda Spagnola, RN)    Assessment & Plan:  DM: he does not need insulin yet, but he will with time.   Patient is advised the following: Patient Instructions  check your blood sugar 1 time a day.  vary the time of day when you check, between before the 3 meals, and at bedtime.  also check if you have symptoms of your blood sugar being too high or too low.  please keep a record of the readings and  bring it to your next appointment here.  please call us sooner if your blood sugar goes below 70, or if it stays over 150.    Please come back for a follow-up appointment in 3 months.   blood tests are being requested for you today.  we'll contact you with results.    With time, your diabetes will get to the point where you need insulin.  It is easy to take, and practically painless, so don't worry about that.  Please see Cristy FolksLinda Spagnola, RN, today, to learn about injection insulin, in case you need to.   addendum: Please continue the same medications

## 2014-09-25 ENCOUNTER — Other Ambulatory Visit: Payer: Self-pay

## 2014-09-26 ENCOUNTER — Other Ambulatory Visit: Payer: Self-pay

## 2014-09-27 ENCOUNTER — Telehealth: Payer: Self-pay | Admitting: Internal Medicine

## 2014-09-27 ENCOUNTER — Encounter: Payer: Self-pay | Admitting: Internal Medicine

## 2014-09-27 ENCOUNTER — Ambulatory Visit (INDEPENDENT_AMBULATORY_CARE_PROVIDER_SITE_OTHER): Payer: 59 | Admitting: Internal Medicine

## 2014-09-27 VITALS — BP 122/64 | HR 57 | Temp 97.8°F | Ht 74.0 in | Wt 189.4 lb

## 2014-09-27 DIAGNOSIS — M25561 Pain in right knee: Secondary | ICD-10-CM | POA: Diagnosis not present

## 2014-09-27 NOTE — Telephone Encounter (Signed)
Relation to pt: self Call back number: [phone]   Reason for call:  Pt would like to schedule physical before he's vacation in August and insisting to come in next month (June) in the afternoon.  Dr. Drue NovelPaz first available physical is in August. Please advise

## 2014-09-27 NOTE — Progress Notes (Signed)
Pre visit review using our clinic review tool, if applicable. No additional management support is needed unless otherwise documented below in the visit note. 

## 2014-09-27 NOTE — Progress Notes (Signed)
Subjective:    Patient ID: Daniel [name], male    DOB: 11-Jun-1955, 59 y.o.   MRN: [id number]  DOS:  09/27/2014 Type of visit - description : acute Interval history: Symptoms started 3 weeks ago with pain at the right popliteal area, with certain movements. 2 weeks ago he developed swelling anteriorly and more intense pain at the popliteal and frontal aspect of the knee with any type of movement. No locking Occasionally feels that the knee will give out. Had similar symptoms a few years ago, does not recall the side ?R  ?L   Review of Systems Denies fever chills. No immediate redness No chest pain, difficulty breathing or palpitations. No calf swelling  No recent injury.  Past Medical History  Diagnosis Date  . Vitiligo     dx in the 90s  . Diabetes mellitus 05/17/07  . Hyperlipidemia   . Knee pain     w/u included a MRI of the knee (-) and back, dx w/ Spinal Stenosis (believed to be causing knee pain)  . Normal cardiac stress test 09/2007    Past Surgical History  Procedure Laterality Date  . Finger surgery    . Shoulder surgery  09-2010    LEFT    History   Social History  . Marital Status: Married    Spouse Name: N/A  . Number of Children: 2  . 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 Smoking status: Former Smoker -- 0.10 packs/day    Types: Cigarettes    Quit date: 05/25/2011  . Smokeless tobacco: Never Used  . Alcohol Use: Yes     Comment: rare  . Drug Use: No  . Sexual Activity: Not on file   Other Topics Concern  . Not on file   Social History Narrative   From [place]         Medication List       This list is accurate as of: 09/27/14 11:59 PM.  Always use your most recent med list.               bromocriptine 2.5 MG tablet  Commonly known as:  PARLODEL  Take 1 tablet (2.5 mg total) by mouth 2 (two) times daily.     colesevelam 625 MG tablet  Commonly known as:  WELCHOL    Take 3 tablets (1,875 mg total) by mouth daily with breakfast.     INVOKANA 300 MG Tabs tablet  Generic drug:  canagliflozin  TAKE 1 TABLET BY MOUTH EVERY DAY     metFORMIN 1000 MG tablet  Commonly known as:  GLUCOPHAGE  Take 1 tablet (1,000 mg total) by mouth 2 (two) times daily with a meal. DUE FOR APPT WITH DR. Jasemine Nawaz. [phone]     ondansetron 4 MG tablet  Commonly known as:  ZOFRAN  Take 4 mg by mouth every 8 (eight) hours as needed for nausea or vomiting.     ONE TOUCH LANCETS Misc  by Does not apply route 4 (four) times daily.     pioglitazone 30 MG tablet  Commonly known as:  ACTOS  Take 1 tablet (30 mg total) by mouth daily.     repaglinide 2 MG tablet  Commonly known as:  PRANDIN  Take 1 tablet (2 mg total) by mouth 3 (three) times daily before meals.     saxagliptin HCl 5 MG Tabs tablet  Commonly known as:  ONGLYZA  Take 1 tablet (5 mg total) by mouth daily.           Objective:   Physical Exam BP 122/64 mmHg  Pulse 57  Temp(Src) 97.8 F (36.6 C) (Oral)  Ht 6\' 2"  (1.88 m)  Wt 189 lb 6 oz (85.9 kg)  BMI 24.30 kg/m2  SpO2 98% General:   Well developed, well nourished . NAD.  HEENT:  Normocephalic . Face symmetric, atraumatic MSK: Left knee normal Right knee: Mild swelling at the antero medial  proximal aspect of the knee.  ROM  normal No red, warm. Joint disease is stable, mild pain elicited with lateral pressure Skin: Not pale. Not jaundice Neurologic:  alert & oriented X3.  Speech normal, gait minimally antalgic due to pain Psych--  Cognition and judgment appear intact.  Cooperative with normal attention span and concentration.  Behavior appropriate. No anxious or depressed appearing.    Assessment & Plan:

## 2014-09-27 NOTE — Assessment & Plan Note (Signed)
Right knee pain started 3 weeks ago, very low suspicious for septic joint, likely has a internal derangement. Plan: Ice, Tylenol, knee sleeve and we'll ask orthopedic surgery to see within a week.

## 2014-09-27 NOTE — Patient Instructions (Signed)
Knee sleeve  Ice twice a day  Tylenol  500 mg OTC 2 tabs a day every 8 hours as needed for pain  Please schedule labs visit for a complete physical exam at your earliest convenience

## 2014-09-27 NOTE — Telephone Encounter (Signed)
Okay to put 2-15 minute appts together.  

## 2014-09-30 NOTE — Telephone Encounter (Signed)
LVM advising pt to call back to schedule physical appointment

## 2014-10-03 ENCOUNTER — Other Ambulatory Visit: Payer: Self-pay | Admitting: Internal Medicine

## 2014-10-09 ENCOUNTER — Telehealth: Payer: Self-pay | Admitting: Internal Medicine

## 2014-10-09 NOTE — Telephone Encounter (Signed)
Pre Visit letter sent  °

## 2014-10-10 ENCOUNTER — Other Ambulatory Visit: Payer: Self-pay

## 2014-10-31 ENCOUNTER — Telehealth: Payer: Self-pay | Admitting: *Deleted

## 2014-10-31 NOTE — Telephone Encounter (Signed)
Unable to reach patient at time of Pre-Visit Call.  Left message for patient to return call when available.    

## 2014-11-01 ENCOUNTER — Encounter: Payer: Self-pay | Admitting: Internal Medicine

## 2014-11-01 ENCOUNTER — Ambulatory Visit (INDEPENDENT_AMBULATORY_CARE_PROVIDER_SITE_OTHER): Payer: 59 | Admitting: Internal Medicine

## 2014-11-01 VITALS — BP 98/62 | HR 58 | Temp 97.8°F | Ht 74.0 in | Wt 191.0 lb

## 2014-11-01 DIAGNOSIS — Z23 Encounter for immunization: Secondary | ICD-10-CM

## 2014-11-01 DIAGNOSIS — Z0000 Encounter for general adult medical examination without abnormal findings: Secondary | ICD-10-CM | POA: Diagnosis not present

## 2014-11-01 LAB — COMPREHENSIVE METABOLIC PANEL
ALBUMIN: 4.4 g/dL (ref 3.5–5.2)
ALT: 22 U/L (ref 0–53)
AST: 28 U/L (ref 0–37)
Alkaline Phosphatase: 62 U/L (ref 39–117)
BUN: 22 mg/dL (ref 6–23)
CO2: 26 meq/L (ref 19–32)
CREATININE: 0.69 mg/dL (ref 0.40–1.50)
Calcium: 9.3 mg/dL (ref 8.4–10.5)
Chloride: 104 mEq/L (ref 96–112)
GFR: 125.86 mL/min (ref >60.00)
Glucose, Bld: 109 mg/dL — ABNORMAL HIGH (ref 70–99)
Potassium: 3.7 mEq/L (ref 3.5–5.1)
SODIUM: 137 meq/L (ref 135–145)
Total Bilirubin: 0.6 mg/dL (ref 0.2–1.2)
Total Protein: 6.8 g/dL (ref 6.0–8.3)

## 2014-11-01 LAB — LIPID PANEL
CHOLESTEROL: 159 mg/dL (ref 0–200)
HDL: 69.1 mg/dL (ref >39.00)
LDL CALC: 81 mg/dL (ref 0–99)
NONHDL: 89.9
Total CHOL/HDL Ratio: 2
Triglycerides: 47 mg/dL (ref 0.0–149.0)
VLDL: 9.4 mg/dL (ref 0.0–40.0)

## 2014-11-01 LAB — CBC WITH DIFFERENTIAL/PLATELET
BASOS ABS: 0 10*3/uL (ref 0.0–0.1)
Basophils Relative: 0.5 % (ref 0.0–3.0)
Eosinophils Absolute: 0.3 10*3/uL (ref 0.0–0.7)
Eosinophils Relative: 6.2 % — ABNORMAL HIGH (ref 0.0–5.0)
HEMATOCRIT: 42.8 % (ref 39.0–52.0)
Hemoglobin: 14.1 g/dL (ref 13.0–17.0)
Lymphocytes Relative: 25.7 % (ref 12.0–46.0)
Lymphs Abs: 1.2 10*3/uL (ref 0.7–4.0)
MCHC: 33.1 g/dL (ref 30.0–36.0)
MCV: 84.7 fl (ref 78.0–100.0)
Monocytes Absolute: 0.4 10*3/uL (ref 0.1–1.0)
Monocytes Relative: 8.1 % (ref 3.0–12.0)
Neutro Abs: 2.9 10*3/uL (ref 1.4–7.7)
Neutrophils Relative %: 59.5 % (ref 43.0–77.0)
PLATELETS: 149 10*3/uL — AB (ref 150.0–400.0)
RBC: 5.05 Mil/uL (ref 4.22–5.81)
RDW: 15 % (ref 11.5–15.5)
WBC: 4.8 10*3/uL (ref 4.0–10.5)

## 2014-11-01 LAB — TSH: TSH: 2.01 u[IU]/mL (ref 0.35–4.50)

## 2014-11-01 LAB — T3, FREE: T3, Free: 2.8 pg/mL (ref 2.3–4.2)

## 2014-11-01 LAB — T4, FREE: FREE T4: 0.89 ng/dL (ref 0.60–1.60)

## 2014-11-01 NOTE — Assessment & Plan Note (Addendum)
Td 10-07 pnm shot today  Cscope for hemathochezia 07-2008 no polyps, (+) hemorrhoids, next 10 years  Prostate cancer screening DRE normal today, check  PSA BP slightly low but asymptomatic, recommend to check his BP from time to time   Diet and exercise discussed  EKG-- badicardia, at  baseline Labs: CMP, FLP, CBc, TSH, free T3 -T4 RTC 1 year

## 2014-11-01 NOTE — Patient Instructions (Signed)
Get your blood work before you leave    Check the  blood pressure 2 or 3 times a month  Be sure your blood pressure is between 110/65 and  145/85.  if it is consistently higher or lower, let me know    

## 2014-11-01 NOTE — Progress Notes (Signed)
Subjective:    Patient ID: Daniel [name], male    DOB: [birth year], 59 y.o.   MRN: [id number]  DOS:  11/01/2014 Type of visit - description : cpx Interval history: Good compliance with medication, BP today is noted to be slightly low, he is feeling well despite a low BP, he simply tired from working 14 hours last night   Review of Systems Constitutional: No fever. No chills. No unexplained wt changes. No unusual sweats  HEENT: No dental problems, no ear discharge, no facial swelling, no voice changes. No eye discharge, no eye  redness , no  intolerance to light   Respiratory: No wheezing , no  difficulty breathing. No cough , no mucus production  Cardiovascular: No CP, no leg swelling , no  Palpitations  GI: no nausea, no vomiting, no diarrhea , no  abdominal pain.  No blood in the stools. No dysphagia, no odynophagia    Endocrine: No polyphagia, no polyuria , no polydipsia  GU: No dysuria, gross hematuria, difficulty urinating. No urinary urgency, no frequency.  Musculoskeletal: No joint swellings , to see ortho about knee pain  Skin: No change in the color of the skin, palor , no  Rash  Allergic, immunologic: No environmental allergies , no  food allergies  Neurological: No dizziness no  syncope. No headaches. No diplopia, no slurred, no slurred speech, no motor deficits, no facial  Numbness  Hematological: No enlarged lymph nodes, no easy bruising , no unusual bleedings  Psychiatry: No suicidal ideas, no hallucinations, no beavior problems, no confusion.  No unusual/severe anxiety, no depression    Past Medical History  Diagnosis Date  . Vitiligo     dx in the 90s  . Diabetes mellitus 05/17/07  . Hyperlipidemia   . Knee pain     w/u included a MRI of the knee (-) and back, dx w/ Spinal Stenosis (believed to be causing knee pain)  . Normal cardiac stress test 09/2007    Past Surgical History  Procedure Laterality Date  . Finger surgery    . Shoulder surgery   09-2010    LEFT    History   Social History  . Marital Status: Married    Spouse Name: N/A  . Number of Children: 2  . 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 Smoking status: Former Smoker -- 0.10 packs/day    Types: Cigarettes    Quit date: 05/25/2011  . Smokeless tobacco: Never Used  . Alcohol Use: Yes     Comment: rare  . Drug Use: No  . Sexual Activity: Not on file   Other Topics Concern  . Not on file   Social History Narrative   From [place]----      Family History  Problem Relation Age of Onset  . Coronary artery disease Father     mid 26s  . Hypertension Mother   . Stroke Mother   . Diabetes Father   . Diabetes Mother   . Colon cancer Neg Hx   . Prostate cancer Neg Hx        Medication List       This list is accurate as of: 11/01/14 11:59 PM.  Always use your most recent med list.               bromocriptine 2.5 MG tablet  Commonly known as:  PARLODEL  Take 1 tablet (2.5 mg total)  by mouth 2 (two) times daily.     colesevelam 625 MG tablet  Commonly known as:  WELCHOL  Take 3 tablets (1,875 mg total) by mouth daily with breakfast.     INVOKANA 300 MG Tabs tablet  Generic drug:  canagliflozin  TAKE 1 TABLET BY MOUTH EVERY DAY     metFORMIN 1000 MG tablet  Commonly known as:  GLUCOPHAGE  TAKE 1 TABLET BY MOUTH TWICE DAILY WITH A MEAL     ONE TOUCH LANCETS Misc  by Does not apply route 4 (four) times daily.     pioglitazone 30 MG tablet  Commonly known as:  ACTOS  Take 1 tablet (30 mg total) by mouth daily.     repaglinide 2 MG tablet  Commonly known as:  PRANDIN  Take 1 tablet (2 mg total) by mouth 3 (three) times daily before meals.     saxagliptin HCl 5 MG Tabs tablet  Commonly known as:  ONGLYZA  Take 1 tablet (5 mg total) by mouth daily.           Objective:   Physical Exam BP 98/62 mmHg  Pulse 58  Temp(Src) 97.8 F (36.6 C) (Oral)  Ht  (1.88 m)   Wt 191 lb (86.637 kg)  BMI 24.51 kg/m2  SpO2 98% General:   Well developed, well nourished . NAD.  Neck:  Full range of motion. Supple. No  thyromegaly , normal carotid pulse HEENT:  Normocephalic . Face symmetric, atraumatic Lungs:  CTA B Normal respiratory effort, no intercostal retractions, no accessory muscle use. Heart: RRR,  no murmur.  No pretibial edema bilaterally  Abdomen:  Not distended, soft, non-tender. No rebound or rigidity. No mass,organomegaly Rectal:  External abnormalities: none. Normal sphincter tone. No rectal masses or tenderness.  Stool brown  Prostate: Prostate gland firm and smooth, no enlargement, nodularity, tenderness, mass, asymmetry or induration.  Skin: Exposed areas without rash. Not pale. Not jaundice Neurologic:  alert & oriented X3.  Speech normal, gait appropriate for age and unassisted Strength symmetric and appropriate for age.  Psych: Cognition and judgment appear intact.  Cooperative with normal attention span and concentration.  Behavior appropriate. No anxious or depressed appearing.       Assessment & Plan:

## 2014-11-01 NOTE — Progress Notes (Signed)
Pre visit review using our clinic review tool, if applicable. No additional management support is needed unless otherwise documented below in the visit note. 

## 2014-12-13 ENCOUNTER — Other Ambulatory Visit: Payer: Self-pay | Admitting: Endocrinology

## 2014-12-16 ENCOUNTER — Other Ambulatory Visit: Payer: Self-pay

## 2014-12-16 MED ORDER — METFORMIN HCL 1000 MG PO TABS: ORAL_TABLET | ORAL | Status: DC

## 2014-12-30 ENCOUNTER — Ambulatory Visit: Payer: Self-pay | Admitting: Endocrinology

## 2015-01-13 ENCOUNTER — Other Ambulatory Visit: Payer: Self-pay | Admitting: Endocrinology

## 2015-03-04 ENCOUNTER — Other Ambulatory Visit: Payer: Self-pay

## 2015-03-04 MED ORDER — PIOGLITAZONE HCL 30 MG PO TABS: 30.0000 mg | ORAL_TABLET | Freq: Every day | ORAL | Status: DC

## 2015-05-14 ENCOUNTER — Other Ambulatory Visit: Payer: Self-pay | Admitting: Endocrinology

## 2015-06-28 ENCOUNTER — Other Ambulatory Visit: Payer: Self-pay | Admitting: Endocrinology

## 2015-07-09 ENCOUNTER — Encounter: Payer: 59 | Attending: Endocrinology | Admitting: Nutrition

## 2015-07-09 ENCOUNTER — Ambulatory Visit (INDEPENDENT_AMBULATORY_CARE_PROVIDER_SITE_OTHER): Payer: 59 | Admitting: Endocrinology

## 2015-07-09 ENCOUNTER — Encounter: Payer: Self-pay | Admitting: Endocrinology

## 2015-07-09 VITALS — BP 107/70 | HR 58 | Temp 97.5°F | Ht 74.0 in | Wt 184.0 lb

## 2015-07-09 DIAGNOSIS — E119 Type 2 diabetes mellitus without complications: Secondary | ICD-10-CM | POA: Insufficient documentation

## 2015-07-09 DIAGNOSIS — E1121 Type 2 diabetes mellitus with diabetic nephropathy: Secondary | ICD-10-CM

## 2015-07-09 LAB — POCT GLYCOSYLATED HEMOGLOBIN (HGB A1C): Hemoglobin A1C: 9.1

## 2015-07-09 MED ORDER — INSULIN ASPART 100 UNIT/ML FLEXPEN: 5.0000 [IU] | PEN_INJECTOR | Freq: Three times a day (TID) | SUBCUTANEOUS | Status: DC

## 2015-07-09 NOTE — Patient Instructions (Addendum)
check your blood sugar 1 time a day.  vary the time of day when you check, between before the 3 meals, and at bedtime.  also check if you have symptoms of your blood sugar being too high or too low.  please keep a record of the readings and bring it to your next appointment here.  please call us sooner if your blood sugar goes below 70, or if it stays over 150.    Please come back for a follow-up appointment in 2 weeks.   i have sent a prescription to your pharmacy, for the insulin.     Please continue the same diabetes pills for now, but we'll stop them with time.

## 2015-07-09 NOTE — Progress Notes (Signed)
Subjective:    Patient ID: Daniel [name], male    DOB: [birth year], 60 y.o.   MRN: [id number]  HPI Pt returns for f/u of diabetes mellitus: DM type: 2 Dx'ed: 2010 Complications:  Therapy: 6 oral meds DKA: never Severe hypoglycemia: never Pancreatitis: never Other: he has never taken insulin; he has vitiligo, so it is expected that he will evolve type 1 DM; he works 3rd shift. Interval history: no cbg record, but states cbg's are well-controlled. He takes meds as rx'ed.  pt states he feels well in general, except for weight loss.   Past Medical History  Diagnosis Date  . Vitiligo     dx in the 90s  . Diabetes mellitus 05/17/07  . Hyperlipidemia   . Knee pain     w/u included a MRI of the knee (-) and back, dx w/ Spinal Stenosis (believed to be causing knee pain)  . Normal cardiac stress test 09/2007  . DJD (degenerative joint disease) of knee     Right knee, end stage, injected 09/2014, Dr. Jerl Santos  . Flexor tenosynovitis of finger     Left, Dr. Jerl Santos  . Shoulder impingement     Right, Dr. Jerl Santos    Past Surgical History  Procedure Laterality Date  . Finger surgery    . Shoulder surgery  09-2010    Left, arthroscopy    Social History   Social History  . Marital Status: Married    Spouse Name: N/A  . Number of Children: 2  . 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 Smoking status: Former Smoker -- 0.10 packs/day    Types: Cigarettes    Quit date: 05/25/2011  . Smokeless tobacco: Never Used  . Alcohol Use: Yes     Comment: rare  . Drug Use: No  . Sexual Activity: Not on file   Other Topics Concern  . Not on file   Social History Narrative   From [place]----     Current Outpatient Prescriptions on File Prior to Visit  Medication Sig Dispense Refill  . bromocriptine (PARLODEL) 2.5 MG tablet Take 1 tablet (2.5 mg total) by mouth 2 (two) times daily. 60 tablet 11  . INVOKANA 300 MG TABS  tablet TAKE 1 TABLET BY MOUTH EVERY DAY 30 tablet 0  . metFORMIN (GLUCOPHAGE) 1000 MG tablet TAKE 1 TABLET BY MOUTH TWICE DAILY WITH A MEAL 60 tablet 1  . metFORMIN (GLUCOPHAGE) 1000 MG tablet TAKE 1 TABLET BY MOUTH TWICE DAILY WITH A MEAL 60 tablet 4  . ONE TOUCH LANCETS MISC by Does not apply route 4 (four) times daily.      . pioglitazone (ACTOS) 30 MG tablet Take 1 tablet (30 mg total) by mouth daily. 30 tablet 3  . pioglitazone (ACTOS) 30 MG tablet TAKE 1 TABLET BY MOUTH EVERY DAY 30 tablet 0  . repaglinide (PRANDIN) 2 MG tablet Take 1 tablet (2 mg total) by mouth 3 (three) times daily before meals. 90 tablet 11  . saxagliptin HCl (ONGLYZA) 5 MG TABS tablet Take 1 tablet (5 mg total) by mouth daily. 30 tablet 11  . WELCHOL 625 MG tablet TAKE 3 TABLETS BY MOUTH EVERY DAY WITH BREAKFAST 90 tablet 0   No current facility-administered medications on file prior to visit.    No Known Allergies  Family History  Problem Relation Age of Onset  . Coronary artery disease Father  mid 78s  . Hypertension Mother   . Stroke Mother   . Diabetes Father   . Diabetes Mother   . Colon cancer Neg Hx   . Prostate cancer Neg Hx     BP 107/70 mmHg  Pulse 58  Temp(Src) 97.5 F (36.4 C) (Oral)  Ht  (1.88 m)  Wt 184 lb (83.462 kg)  BMI 23.61 kg/m2  SpO2 97%  Review of Systems Denies diarrhea.     Objective:   Physical Exam VITAL SIGNS:  See vs page. GENERAL: no distress. Pulses: dorsalis pedis intact bilat.   MSK: no deformity of the feet CV: no leg edema Skin:  no ulcer on the feet.  normal color and temp on the feet. Neuro: sensation is intact to touch on the feet.     A1c=9.1%    Assessment & Plan:  DM: glycemic control is worse.  He has failed oral rx.  We discussed insulin.  He agrees to take multiple daily injections.   Weight loss.  Most likely due to hyperglycemia, but we'll follow.    Patient is advised the following: Patient Instructions  check your blood sugar 1  time a day.  vary the time of day when you check, between before the 3 meals, and at bedtime.  also check if you have symptoms of your blood sugar being too high or too low.  please keep a record of the readings and bring it to your next appointment here.  please call us sooner if your blood sugar goes below 70, or if it stays over 150.    Please come back for a follow-up appointment in 2 weeks.   i have sent a prescription to your pharmacy, for the insulin.     Please continue the same diabetes pills for now, but we'll stop them with time.

## 2015-07-16 NOTE — Patient Instructions (Signed)
Take Novolog insulin as directed 5-10 before meals Call if blood sugars drop low

## 2015-07-16 NOTE — Progress Notes (Signed)
Patient was instructed on how to use the Novolog pen. We discussed how the insulin works, where/how/when to inject.  We also discussed low blood sugars--symptoms and treatments.  He reported good understanding of this.  He was given a Insurance claims handler with directions for pen use, and written instructions for 5units to be given 5-10 min. Before meals.   He was encourage to test his blood sugars before meals and at bedtime to determine if the 5 units is working to bring down his blood sugars after meals. He had no final questions.

## 2015-07-23 ENCOUNTER — Ambulatory Visit (INDEPENDENT_AMBULATORY_CARE_PROVIDER_SITE_OTHER): Payer: 59 | Admitting: Endocrinology

## 2015-07-23 ENCOUNTER — Encounter: Payer: Self-pay | Admitting: Endocrinology

## 2015-07-23 VITALS — BP 112/64 | HR 60 | Temp 98.2°F | Ht 74.0 in | Wt 187.0 lb

## 2015-07-23 DIAGNOSIS — E1042 Type 1 diabetes mellitus with diabetic polyneuropathy: Secondary | ICD-10-CM

## 2015-07-23 NOTE — Patient Instructions (Addendum)
check your blood sugar twice a day.  vary the time of day when you check, between before the 3 meals, and at bedtime.  also check if you have symptoms of your blood sugar being too high or too low.  please keep a record of the readings and bring it to your next appointment here.  please call us sooner if your blood sugar goes below 70, or if it stays over 150.    Please come back for a follow-up appointment in 1 month.    Please stop the diabetes pills, 1 at a time, about every 5 days or so.  Then increase the insulin as you need to, to 7 and then to 9 units at a time  Our goal is to get the blood sugar to the low to mid-100's.  Call if you have any question.

## 2015-07-23 NOTE — Progress Notes (Signed)
Subjective:    Patient ID: Daniel [name], male    DOB: [birth year], 60 y.o.   MRN: [id number]  HPI Pt returns for f/u of diabetes mellitus: DM type: 2 Dx'ed: 2010 Complications: polyneuropathy. Therapy: insulin since 2017.   DKA: never Severe hypoglycemia: never Pancreatitis: never Other: he takes multiple daily injections.  he has vitiligo, so it is expected that he will evolve type 1 DM; he works 3rd shift. Interval history: he brings his cbg meter, which i have reviewed today.  It varies from 93-180.  There is no trend throughout the day.  It is in general higher if he has eaten recently.   Past Medical History  Diagnosis Date  . Vitiligo     dx in the 90s  . Diabetes mellitus 05/17/07  . Hyperlipidemia   . Knee pain     w/u included a MRI of the knee (-) and back, dx w/ Spinal Stenosis (believed to be causing knee pain)  . Normal cardiac stress test 09/2007  . DJD (degenerative joint disease) of knee     Right knee, end stage, injected 09/2014, Dr. Jerl Santos  . Flexor tenosynovitis of finger     Left, Dr. Jerl Santos  . Shoulder impingement     Right, Dr. Jerl Santos    Past Surgical History  Procedure Laterality Date  . Finger surgery    . Shoulder surgery  09-2010    Left, arthroscopy    Social History   Social History  . Marital Status: Married    Spouse Name: N/A  . Number of Children: 2  . 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 Smoking status: Former Smoker -- 0.10 packs/day    Types: Cigarettes    Quit date: 05/25/2011  . Smokeless tobacco: Never Used  . Alcohol Use: Yes     Comment: rare  . Drug Use: No  . Sexual Activity: Not on file   Other Topics Concern  . Not on file   Social History Narrative   From [place]----     Current Outpatient Prescriptions on File Prior to Visit  Medication Sig Dispense Refill  . insulin aspart (NOVOLOG FLEXPEN) 100 UNIT/ML FlexPen Inject 5 Units into  the skin 3 (three) times daily with meals. And pen needles 3/day 15 mL 11  . INVOKANA 300 MG TABS tablet TAKE 1 TABLET BY MOUTH EVERY DAY 30 tablet 0  . metFORMIN (GLUCOPHAGE) 1000 MG tablet TAKE 1 TABLET BY MOUTH TWICE DAILY WITH A MEAL 60 tablet 1  . ONE TOUCH LANCETS MISC by Does not apply route 4 (four) times daily.      . pioglitazone (ACTOS) 30 MG tablet Take 1 tablet (30 mg total) by mouth daily. 30 tablet 3  . pioglitazone (ACTOS) 30 MG tablet TAKE 1 TABLET BY MOUTH EVERY DAY 30 tablet 0  . repaglinide (PRANDIN) 2 MG tablet Take 1 tablet (2 mg total) by mouth 3 (three) times daily before meals. 90 tablet 11   No current facility-administered medications on file prior to visit.    No Known Allergies  Family History  Problem Relation Age of Onset  . Coronary artery disease Father     mid 47s  . Hypertension Mother   . Stroke Mother   . Diabetes Father   . Diabetes Mother   . Colon cancer Neg Hx   . Prostate cancer Neg Hx     BP 112/64  mmHg  Pulse 60  Temp(Src) 98.2 F (36.8 C) (Oral)  Ht  (1.88 m)  Wt 187 lb (84.823 kg)  BMI 24.00 kg/m2  SpO2 97%  Review of Systems He denies hypoglycemia.      Objective:   Physical Exam VITAL SIGNS:  See vs page GENERAL: no distress SKIN:  Insulin injection sites at the anterior abdomen are normal.     Lab Results  Component Value Date   HGBA1C 9.1 07/09/2015      Assessment & Plan:  DM: he is ready to transition off oral rx.    Patient is advised the following: Patient Instructions  check your blood sugar twice a day.  vary the time of day when you check, between before the 3 meals, and at bedtime.  also check if you have symptoms of your blood sugar being too high or too low.  please keep a record of the readings and bring it to your next appointment here.  please call us sooner if your blood sugar goes below 70, or if it stays over 150.    Please come back for a follow-up appointment in 1 month.    Please stop the  diabetes pills, 1 at a time, about every 5 days or so.  Then increase the insulin as you need to, to 7 and then to 9 units at a time  Our goal is to get the blood sugar to the low to mid-100's.  Call if you have any question.

## 2015-07-24 ENCOUNTER — Other Ambulatory Visit: Payer: Self-pay | Admitting: Endocrinology

## 2015-07-24 DIAGNOSIS — E119 Type 2 diabetes mellitus without complications: Secondary | ICD-10-CM | POA: Insufficient documentation

## 2015-07-29 ENCOUNTER — Other Ambulatory Visit: Payer: Self-pay | Admitting: Endocrinology

## 2015-07-29 NOTE — Telephone Encounter (Signed)
This meds was d/c'ed

## 2015-07-29 NOTE — Telephone Encounter (Signed)
Please advise if ok to refill. Med is not on current med list.  

## 2015-08-24 NOTE — Progress Notes (Signed)
   Subjective:    Patient ID: Daniel [name], male    DOB: 1955-07-27, 60 y.o.   MRN: [id number]  HPI Pt returns for f/u of diabetes mellitus: DM type: 1  Dx'ed: 2010 Complications: polyneuropathy.  Therapy: insulin since 2017.   DKA: never Severe hypoglycemia: never Pancreatitis: never Other: he takes multiple daily injections; he has vitiligo;he works 3rd shift. Interval history: he brings his cbg meter, which i have reviewed today.  It varies from 93-180.  There is no trend throughout the day.  It is in general higher if he has eaten recently.     Review of Systems     Objective:   Physical Exam        Assessment & Plan:   This encounter was created in error - please disregard.

## 2015-08-24 NOTE — Patient Instructions (Signed)
check your blood sugar twice a day.  vary the time of day when you check, between before the 3 meals, and at bedtime.  also check if you have symptoms of your blood sugar being too high or too low.  please keep a record of the readings and bring it to your next appointment here.  please call us sooner if your blood sugar goes below 70, or if it stays over 150.    Please come back for a follow-up appointment in 1 month.    Please stop the diabetes pills, 1 at a time, about every 5 days or so.  Then increase the insulin as you need to, to 7 and then to 9 units at a time  Our goal is to get the blood sugar to the low to mid-100's.  Call if you have any question.

## 2015-08-25 ENCOUNTER — Encounter: Payer: Self-pay | Admitting: Endocrinology

## 2015-09-17 ENCOUNTER — Encounter: Payer: Self-pay | Admitting: Endocrinology

## 2015-09-17 ENCOUNTER — Ambulatory Visit (INDEPENDENT_AMBULATORY_CARE_PROVIDER_SITE_OTHER): Payer: 59 | Admitting: Endocrinology

## 2015-09-17 VITALS — BP 110/70 | HR 58 | Temp 97.5°F | Resp 14 | Ht 74.0 in | Wt 191.6 lb

## 2015-09-17 DIAGNOSIS — E1042 Type 1 diabetes mellitus with diabetic polyneuropathy: Secondary | ICD-10-CM | POA: Diagnosis not present

## 2015-09-17 LAB — MICROALBUMIN / CREATININE URINE RATIO
Creatinine,U: 92.5 mg/dL
MICROALB UR: 1 mg/dL (ref 0.0–1.9)
MICROALB/CREAT RATIO: 1.1 mg/g (ref 0.0–30.0)

## 2015-09-17 MED ORDER — INSULIN ASPART 100 UNIT/ML FLEXPEN: 7.0000 [IU] | PEN_INJECTOR | Freq: Three times a day (TID) | SUBCUTANEOUS | Status: DC

## 2015-09-17 MED ORDER — BASAGLAR KWIKPEN 100 UNIT/ML ~~LOC~~ SOPN: 10.0000 [IU] | PEN_INJECTOR | Freq: Every day | SUBCUTANEOUS | Status: DC

## 2015-09-17 NOTE — Progress Notes (Signed)
Subjective:    Patient ID: Daniel [name], male    DOB: 1955/06/13, 60 y.o.   MRN: [id number]  HPI Pt returns for f/u of diabetes mellitus: DM type: 1 is dx'ed, due to vitiligo, lean body habitus, and failure of oral rx Dx'ed: 2010 Complications: polyneuropathy. Therapy: insulin since 2017.   DKA: never Severe hypoglycemia: never Pancreatitis: never Other: he takes multiple daily injections. he works 3rd shift. Interval history: no cbg record, but states cbg's vary from 180-370.  There is no trend throughout the day.  Past Medical History  Diagnosis Date  . Vitiligo     dx in the 90s  . Diabetes mellitus 05/17/07  . Hyperlipidemia   . Knee pain     w/u included a MRI of the knee (-) and back, dx w/ Spinal Stenosis (believed to be causing knee pain)  . Normal cardiac stress test 09/2007  . DJD (degenerative joint disease) of knee     Right knee, end stage, injected 09/2014, Dr. Jerl Santosalldorf  . Flexor tenosynovitis of finger     Left, Dr. Jerl Santosalldorf  . Shoulder impingement     Right, Dr. Jerl Santosalldorf    Past Surgical History  Procedure Laterality Date  . Finger surgery    . Shoulder surgery  09-2010    Left, arthroscopy    Social History   Social History  . Marital Status: Married    Spouse Name: N/A  . Number of Children: 2  . 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 Smoking status: Former Smoker -- 0.10 packs/day    Types: Cigarettes    Quit date: 05/25/2011  . Smokeless tobacco: Never Used  . Alcohol Use: Yes     Comment: rare  . Drug Use: No  . Sexual Activity: Not on file   Other Topics Concern  . Not on file   Social History Narrative   From [place]     Current Outpatient Prescriptions on File Prior to Visit  Medication Sig Dispense Refill  . ONE TOUCH LANCETS MISC by Does not apply route 4 (four) times daily.       No current facility-administered medications on file prior to visit.     No Known Allergies  Family History  Problem Relation Age of Onset  . Coronary artery disease Father     mid 6780s  . Hypertension Mother   . Stroke Mother   . Diabetes Father   . Diabetes Mother   . Colon cancer Neg Hx   . Prostate cancer Neg Hx     BP 110/70 mmHg  Pulse 58  Temp(Src) 97.5 F (36.4 C) (Oral)  Resp 14  Ht 6\' 2"  (1.88 m)  Wt 191 lb 9.6 oz (86.909 kg)  BMI 24.59 kg/m2  SpO2 97%  Review of Systems He denies hypoglycemia.  Denies n/v.     Objective:   Physical Exam VITAL SIGNS:  See vs page GENERAL: no distress Pulses: dorsalis pedis intact bilat.   MSK: no deformity of the feet CV: no leg edema Skin:  no ulcer on the feet.  normal color and temp on the feet. Neuro: sensation is intact to touch on the feet      Assessment & Plan:  Type 1 DM: he needs increased rx.  He declines pump, at least for now.  Patient is advised the following: Patient Instructions  check your blood sugar twice a day.  vary the time of day when you check, between before the 3 meals, and at bedtime.  also check if you have symptoms of your blood sugar being too high or too low.  please keep a record of the readings and bring it to your next appointment here.  please call us sooner if your blood sugar goes below 70, or if it stays over 150.    Please continue the same novolog (7 units 3 times a day (with each meal).  i have sent a prescription to your pharmacy, to add "basaglar," 10 units daily.  Please call us next week , to tell us how the blood sugar is doing.   Please come back for a follow-up appointment in 3 months.

## 2015-09-17 NOTE — Patient Instructions (Addendum)
check your blood sugar twice a day.  vary the time of day when you check, between before the 3 meals, and at bedtime.  also check if you have symptoms of your blood sugar being too high or too low.  please keep a record of the readings and bring it to your next appointment here.  please call us sooner if your blood sugar goes below 70, or if it stays over 150.    Please continue the same novolog (7 units 3 times a day (with each meal).  i have sent a prescription to your pharmacy, to add "basaglar," 10 units daily.  Please call us next week , to tell us how the blood sugar is doing.   Please come back for a follow-up appointment in 3 months.

## 2015-12-17 ENCOUNTER — Encounter: Payer: 59 | Attending: Endocrinology | Admitting: Nutrition

## 2015-12-17 ENCOUNTER — Ambulatory Visit (INDEPENDENT_AMBULATORY_CARE_PROVIDER_SITE_OTHER): Payer: 59 | Admitting: Endocrinology

## 2015-12-17 ENCOUNTER — Encounter: Payer: Self-pay | Admitting: Endocrinology

## 2015-12-17 VITALS — BP 130/80 | HR 55 | Ht 74.0 in | Wt 213.0 lb

## 2015-12-17 DIAGNOSIS — Z713 Dietary counseling and surveillance: Secondary | ICD-10-CM | POA: Insufficient documentation

## 2015-12-17 DIAGNOSIS — E1142 Type 2 diabetes mellitus with diabetic polyneuropathy: Secondary | ICD-10-CM

## 2015-12-17 DIAGNOSIS — E1042 Type 1 diabetes mellitus with diabetic polyneuropathy: Secondary | ICD-10-CM | POA: Insufficient documentation

## 2015-12-17 DIAGNOSIS — E138 Other specified diabetes mellitus with unspecified complications: Secondary | ICD-10-CM | POA: Diagnosis not present

## 2015-12-17 LAB — POCT GLYCOSYLATED HEMOGLOBIN (HGB A1C): Hemoglobin A1C: 8.4

## 2015-12-17 MED ORDER — BASAGLAR KWIKPEN 100 UNIT/ML ~~LOC~~ SOPN: 5.0000 [IU] | PEN_INJECTOR | Freq: Every day | SUBCUTANEOUS | 11 refills | Status: DC

## 2015-12-17 MED ORDER — INSULIN ASPART 100 UNIT/ML FLEXPEN: 13.0000 [IU] | PEN_INJECTOR | Freq: Three times a day (TID) | SUBCUTANEOUS | 11 refills | Status: DC

## 2015-12-17 NOTE — Patient Instructions (Addendum)
check your blood sugar twice a day.  vary the time of day when you check, between before the 3 meals, and at bedtime.  also check if you have symptoms of your blood sugar being too high or too low.  please keep a record of the readings and bring it to your next appointment here.  please call us sooner if your blood sugar goes below 70, or if it stays over 150.    Please increase the novolog to 13 units 3 times a day (with each meal), and: Reduce the basaglar to 5 units daily.  Please go back to see the foot specialist.  you will receive a phone call, about a day and time for an appointment Please come back for a follow-up appointment in 3 months.

## 2015-12-17 NOTE — Progress Notes (Signed)
Subjective:    Patient ID: Daniel [name], male    DOB: 1955-11-20, 60 y.o.   MRN: [id number]  HPI Pt returns for f/u of diabetes mellitus: DM type: 1 is dx'ed, due to vitiligo, lean body habitus, and failure of oral rx. Dx'ed: 2010 Complications: polyneuropathy. Therapy: insulin since 2017.   DKA: never.  Severe hypoglycemia: never.  Pancreatitis: never.   Other: he takes multiple daily injections. he works 3rd shift.   Interval history: no cbg record, but states cbg's are in the low to mid-100's.  He says cbg's decrease if he does not eat for many hours.   Past Medical History:  Diagnosis Date  . Diabetes mellitus 05/17/07  . DJD (degenerative joint disease) of knee    Right knee, end stage, injected 09/2014, Dr. Jerl Santos  . Flexor tenosynovitis of finger    Left, Dr. Jerl Santos  . Hyperlipidemia   . Knee pain    w/u included a MRI of the knee (-) and back, dx w/ Spinal Stenosis (believed to be causing knee pain)  . Normal cardiac stress test 09/2007  . Shoulder impingement    Right, Dr. Jerl Santos  . Vitiligo    dx in the 90s    Past Surgical History:  Procedure Laterality Date  . FINGER SURGERY    . SHOULDER SURGERY  09-2010   Left, arthroscopy    Social History   Social History  . Marital status: Married    Spouse name: N/A  . Number of children: 2  . 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 Smoking status: Former Smoker    Packs/day: 0.10    Types: Cigarettes    Quit date: 05/25/2011  . Smokeless tobacco: Never Used  . Alcohol use Yes     Comment: rare  . Drug use: No  . Sexual activity: Not on file   Other Topics Concern  . Not on file   Social History Narrative   From [place]----     Current Outpatient Prescriptions on File Prior to Visit  Medication Sig Dispense Refill  . ONE TOUCH LANCETS MISC by Does not apply route 4 (four) times daily.       No current facility-administered  medications on file prior to visit.     No Known Allergies  Family History  Problem Relation Age of Onset  . Coronary artery disease Father     mid 81s  . Hypertension Mother   . Stroke Mother   . Diabetes Father   . Diabetes Mother   . Colon cancer Neg Hx   . Prostate cancer Neg Hx     BP 130/80   Pulse (!) 55   Ht  (1.88 m)   Wt 213 lb (96.6 kg)   SpO2 98%   BMI 27.35 kg/m   Review of Systems He has gained a few lbs.     Objective:   Physical Exam VITAL SIGNS:  See vs page.   GENERAL: no distress.   Pulses: dorsalis pedis intact bilat.   MSK: no deformity of the feet.   CV: no leg edema.   Skin:  no ulcer on the feet.  normal color and temp on the feet.  There is a heavy callus at the plantar aspect of the left foot.   Neuro: sensation is intact to touch on the feet.    Lab Results  Component Value Date  CREATININE 0.69 11/01/2014   BUN 22 11/01/2014   NA 137 11/01/2014   K 3.7 11/01/2014   CL 104 11/01/2014   CO2 26 11/01/2014   Lab Results  Component Value Date   HGBA1C 8.4 12/17/2015      Assessment & Plan:  Type 1 DM: he needs increased rx Foot callus: worse: please go back to see the foot specialist.  you will receive a phone call, about a day and time for an appointment.    Patient is advised the following: Patient Instructions  check your blood sugar twice a day.  vary the time of day when you check, between before the 3 meals, and at bedtime.  also check if you have symptoms of your blood sugar being too high or too low.  please keep a record of the readings and bring it to your next appointment here.  please call us sooner if your blood sugar goes below 70, or if it stays over 150.    Please increase the novolog to 13 units 3 times a day (with each meal), and: Reduce the basaglar to 5 units daily.  Please go back to see the foot specialist.  you will receive a phone call, about a day and time for an appointment Please come back for a  follow-up appointment in 3 months.      Romero Belling, MD

## 2015-12-30 ENCOUNTER — Encounter: Payer: Self-pay | Admitting: Podiatry

## 2015-12-30 ENCOUNTER — Ambulatory Visit (INDEPENDENT_AMBULATORY_CARE_PROVIDER_SITE_OTHER): Payer: 59 | Admitting: Podiatry

## 2015-12-30 DIAGNOSIS — M7752 Other enthesopathy of left foot: Secondary | ICD-10-CM

## 2015-12-30 DIAGNOSIS — Q828 Other specified congenital malformations of skin: Secondary | ICD-10-CM

## 2015-12-30 DIAGNOSIS — M779 Enthesopathy, unspecified: Secondary | ICD-10-CM

## 2015-12-30 DIAGNOSIS — M778 Other enthesopathies, not elsewhere classified: Secondary | ICD-10-CM

## 2015-12-30 DIAGNOSIS — B079 Viral wart, unspecified: Secondary | ICD-10-CM | POA: Diagnosis not present

## 2015-12-30 NOTE — Patient Instructions (Signed)

## 2015-12-31 NOTE — Progress Notes (Signed)
He presents today for follow-up of a reactive hyperkeratotic lesion plantar aspect of the left foot. Cannot rule out a wart.  Objective: Vital signs are stable alert and oriented 3. Pulses are palpable. Last time he was seen was questionable whether this was a porokeratotic lesion or wart. It is painful medial lateral compression skin last inducing certain that the lesion though there is no thrombosed capillaries upon debridement.  Assessment: Benign soft tissue lesion possible verruca left. This is beneath the fifth metatarsal head of the left foot. None on the right.  Plan: Excision of the lesion today after local anesthetic was administered sublesionally. He tolerated this procedure well the lesion was sent for pathologic evaluation he will start soaking more and Betadine and warm water. With all instructions were provided for care and soaking of his foot. Follow up with him in 1 week.

## 2016-01-06 ENCOUNTER — Ambulatory Visit: Payer: 59 | Admitting: Podiatry

## 2016-01-16 ENCOUNTER — Telehealth: Payer: Self-pay | Admitting: *Deleted

## 2016-01-16 NOTE — Telephone Encounter (Addendum)
Left message for pt to call to make and appt.  Left message that Dr. Al CorpusHyatt had reviewed the 12/30/2015 biopsy results and it was a wart, and it pt had no redness, swelling or drainage, and was healed he did not need to keep his appt, but we did need him to call and cancel.

## 2016-01-27 ENCOUNTER — Ambulatory Visit: Payer: 59 | Admitting: Podiatry

## 2016-02-04 NOTE — Progress Notes (Signed)
Patient reports that he is taking all of his insulin as directed.  He is taking 10u of Novolog 5-10 min. Before hjis meals and his Basaglar once a day.   We reivewed the need to drop the dose of Basaglar to 5u and to increase the dose of Novolog to 13u  Before all meals.  Suggested he keep the Novolog in his pocket at work for when he eats his meals, while working.  He agreed to do this.   I am not certain that he is taking his insulin at work.  He did not bring his meter, and am not sure what his blood sugars are doing.  I asked him to test his blood sugars twice daily--acB and acS one day, and acL and bedtime the next day, and to call me in one week with blood sugar readings.

## 2016-02-04 NOTE — Patient Instructions (Signed)
Test blood sugar twice a day.  One day test before breakfast and supper, and one day test before lunch and bedtime. Call me the blood sugars in one week.  [phone]

## 2016-03-01 IMAGING — CR DG RIBS 2V*R*
4 series · 4 of 4 positions shown · non-contrast
Comparison: Chest x-ray 09/12/2007.

CLINICAL DATA: Right-sided chest pain.

EXAM:
RIGHT RIBS - 2 VIEW

[view not recorded (1 of 4)]
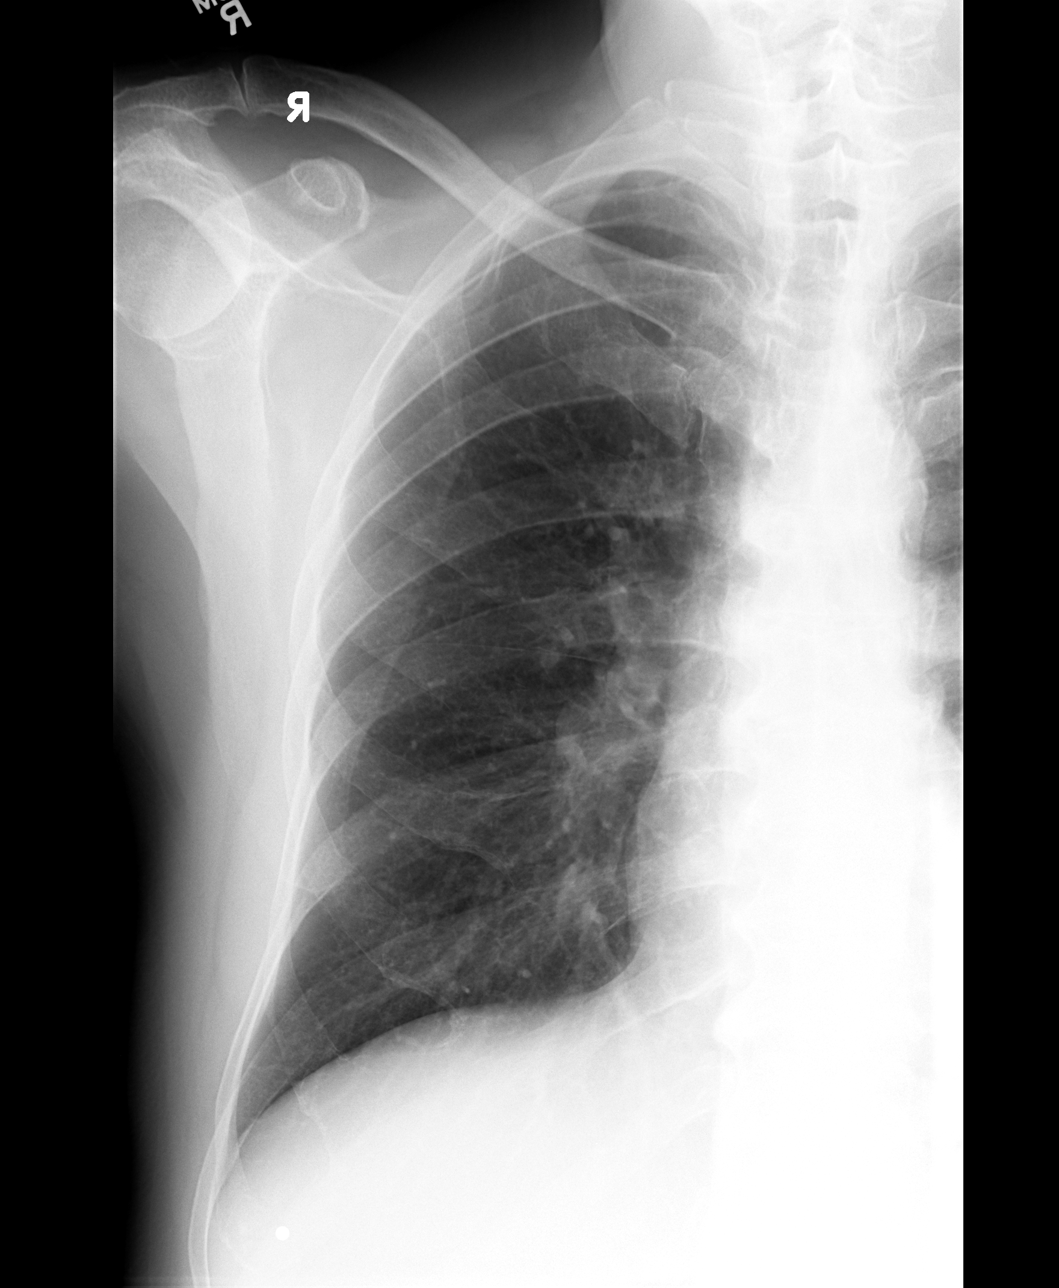

[view not recorded (2 of 4)]
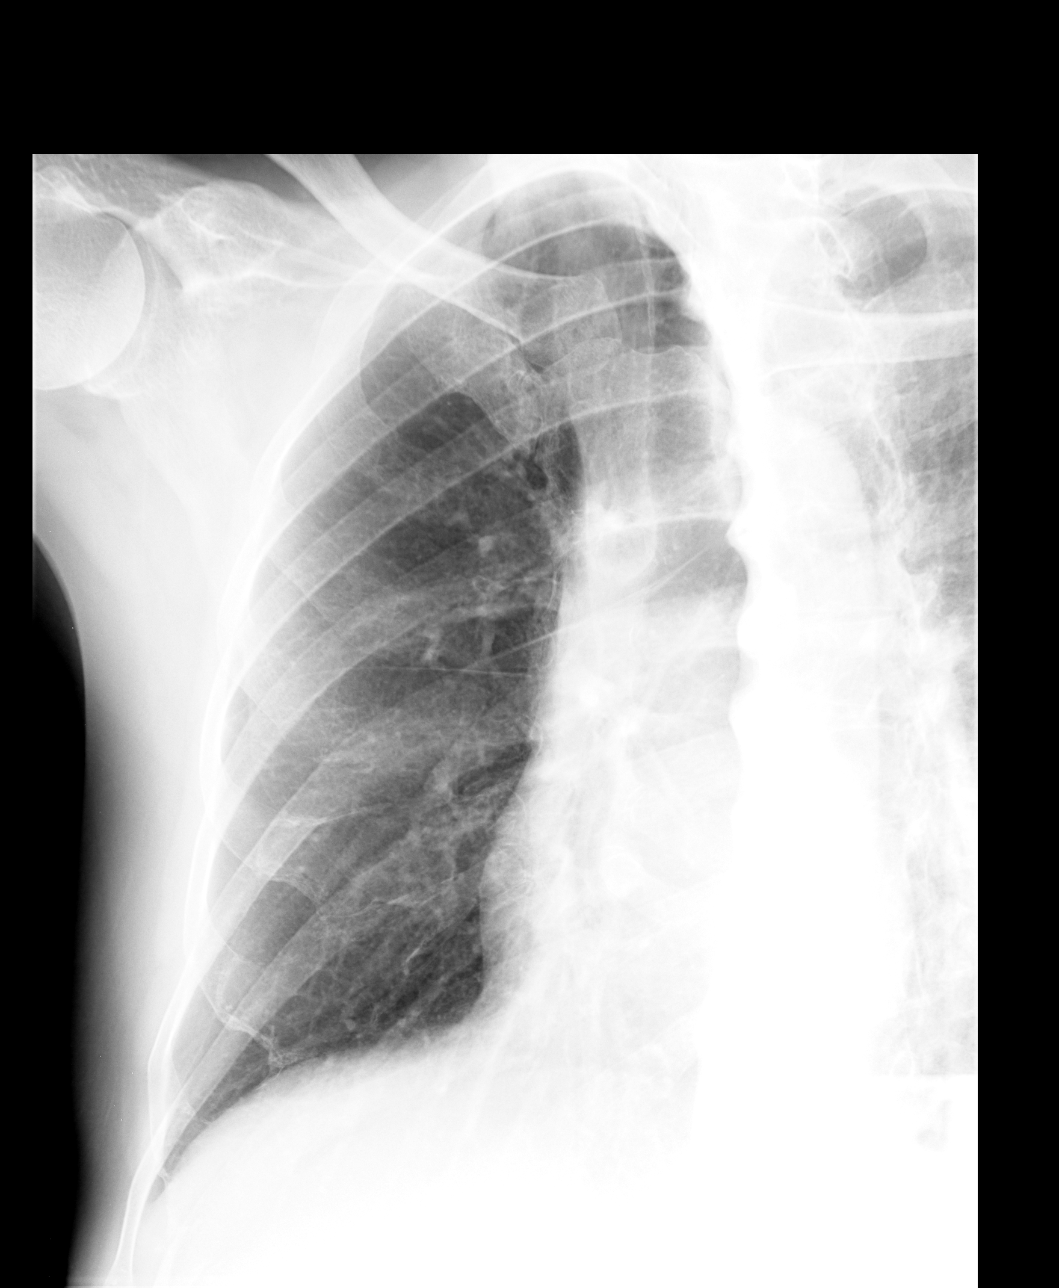

[view not recorded (3 of 4)]
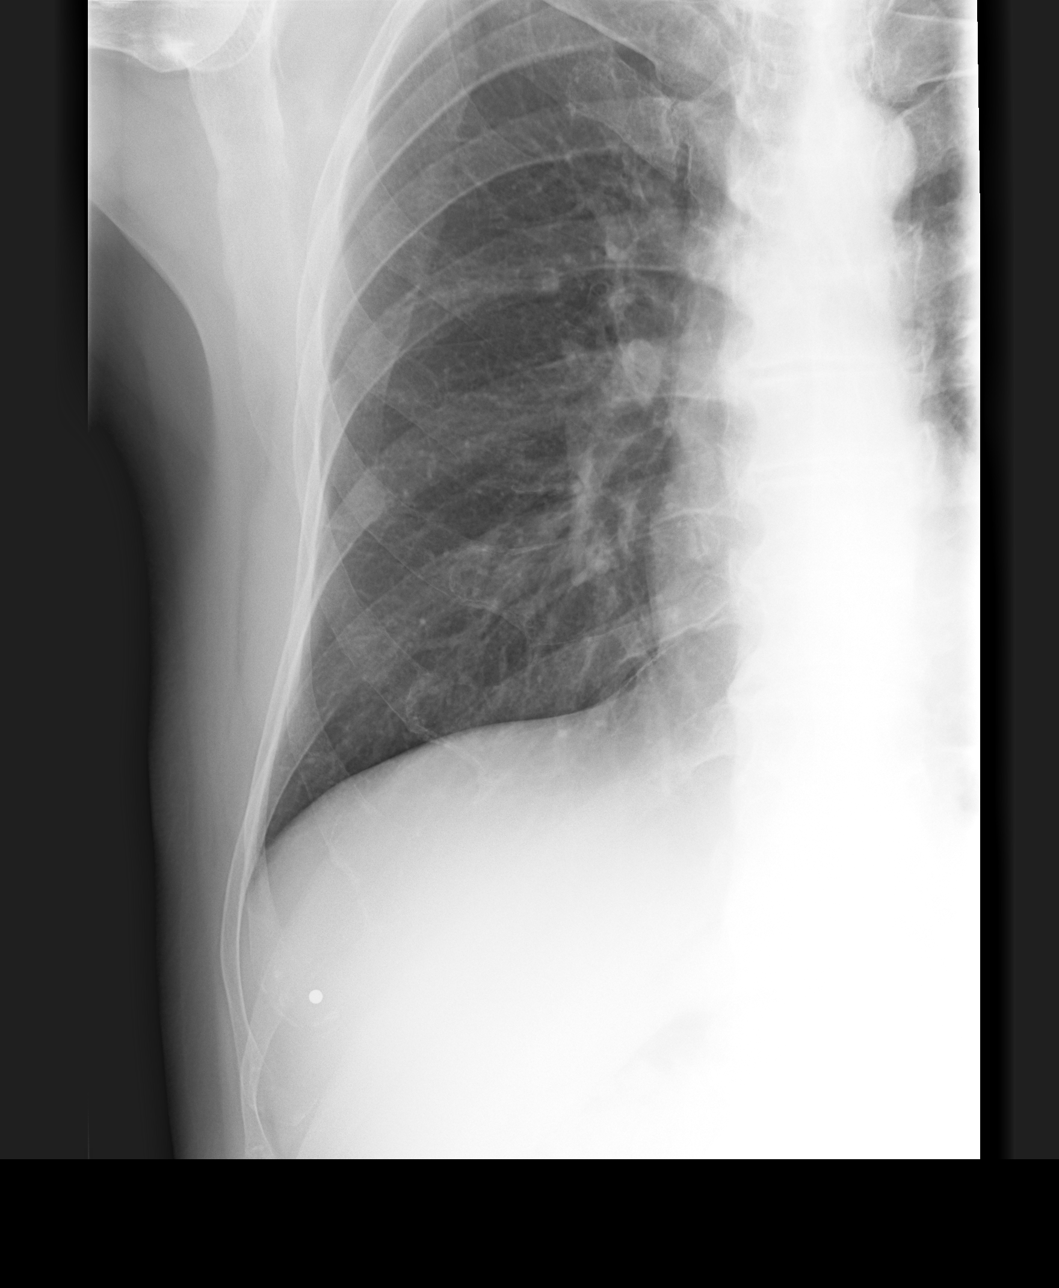

[view not recorded (4 of 4)]
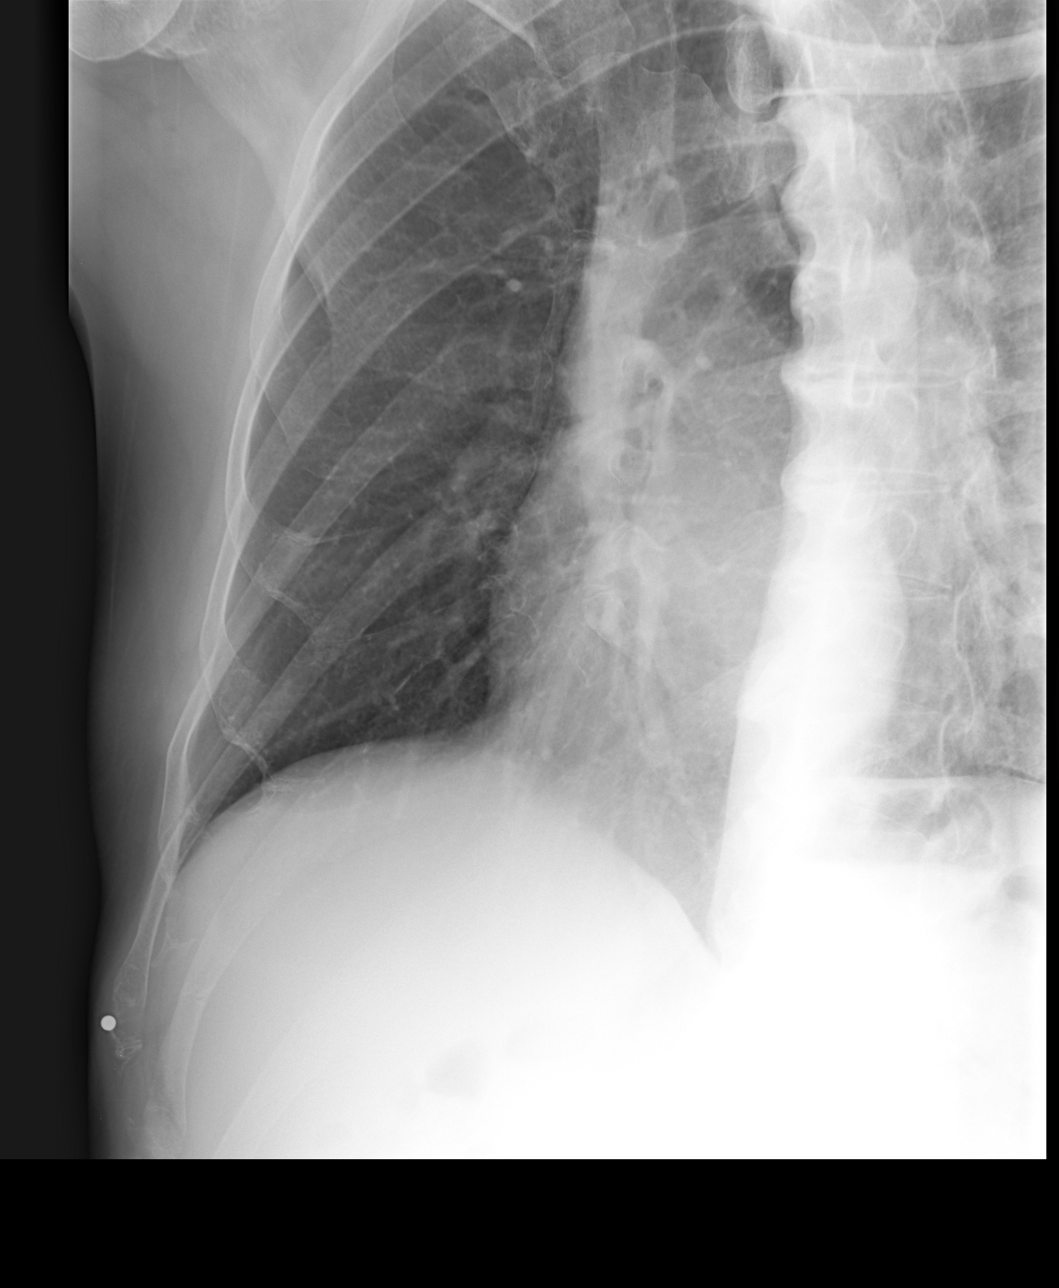

[4 of 4 positions shown; findings below may reference images not displayed]

FINDINGS: The right lung is clear. No acute pulmonary findings or pulmonary
lesions. No pleural effusion or pneumothorax.

The bony structures are intact. No definite acute rib fracture or
bone lesion.
IMPRESSION: No definite right -sided rib lesion

## 2016-03-19 ENCOUNTER — Encounter: Payer: Self-pay | Admitting: Endocrinology

## 2016-03-19 ENCOUNTER — Ambulatory Visit (INDEPENDENT_AMBULATORY_CARE_PROVIDER_SITE_OTHER): Payer: 59 | Admitting: Endocrinology

## 2016-03-19 VITALS — BP 124/70 | HR 56 | Ht 74.0 in | Wt 214.0 lb

## 2016-03-19 DIAGNOSIS — E1142 Type 2 diabetes mellitus with diabetic polyneuropathy: Secondary | ICD-10-CM

## 2016-03-19 LAB — POCT GLYCOSYLATED HEMOGLOBIN (HGB A1C): Hemoglobin A1C: 10.1

## 2016-03-19 MED ORDER — INSULIN ASPART 100 UNIT/ML FLEXPEN: 16.0000 [IU] | PEN_INJECTOR | Freq: Three times a day (TID) | SUBCUTANEOUS | 11 refills | Status: DC

## 2016-03-19 NOTE — Patient Instructions (Addendum)
check your blood sugar twice a day.  vary the time of day when you check, between before the 3 meals, and at bedtime.  also check if you have symptoms of your blood sugar being too high or too low.  please keep a record of the readings and bring it to your next appointment here.  please call us sooner if your blood sugar goes below 70, or if it stays over 150.    Please increase the novolog to 16 units 3 times a day (with each meal), and:  Reduce the basaglar to 5 units daily.  Please come back for a follow-up appointment in 6 weeks.

## 2016-03-19 NOTE — Progress Notes (Signed)
Subjective:    Patient ID: Daniel [name], male    DOB: Apr 20, 1956, 60 y.o.   MRN: [id number]  HPI Pt returns for f/u of diabetes mellitus: DM type: 1 is dx'ed, due to vitiligo, lean body habitus, and failure of oral rx. Dx'ed: 2010 Complications: polyneuropathy. Therapy: insulin since 2017.   DKA: never.  Severe hypoglycemia: never.  Pancreatitis: never.   Other: he takes multiple daily injections. he works 3rd shift.   Interval history: no cbg record, but states cbg's vary from 85-200's.  He says cbg's decrease if he does not eat for many hours.  He says he misses approx 1 dose per month.   Past Medical History:  Diagnosis Date  . Diabetes mellitus 05/17/07  . DJD (degenerative joint disease) of knee    Right knee, end stage, injected 09/2014, Dr. Jerl Santosalldorf  . Flexor tenosynovitis of finger    Left, Dr. Jerl Santosalldorf  . Hyperlipidemia   . Knee pain    w/u included a MRI of the knee (-) and back, dx w/ Spinal Stenosis (believed to be causing knee pain)  . Normal cardiac stress test 09/2007  . Shoulder impingement    Right, Dr. Jerl Santosalldorf  . Vitiligo    dx in the 90s    Past Surgical History:  Procedure Laterality Date  . FINGER SURGERY    . SHOULDER SURGERY  09-2010   Left, arthroscopy    Social History   Social History  . Marital status: Married    Spouse name: N/A  . Number of children: 2  . 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 Smoking status: Former Smoker    Packs/day: 0.10    Types: Cigarettes    Quit date: 05/25/2011  . Smokeless tobacco: Never Used  . Alcohol use Yes     Comment: rare  . Drug use: No  . Sexual activity: Not on file   Other Topics Concern  . Not on file   Social History Narrative   From [place]----     Current Outpatient Prescriptions on File Prior to Visit  Medication Sig Dispense Refill  . Insulin Glargine (BASAGLAR KWIKPEN) 100 UNIT/ML SOPN Inject 0.05 mLs (5 Units  total) into the skin daily. 5 pen 11  . ONE TOUCH LANCETS MISC by Does not apply route 4 (four) times daily.       No current facility-administered medications on file prior to visit.     No Known Allergies  Family History  Problem Relation Age of Onset  . Coronary artery disease Father     mid 4580s  . Hypertension Mother   . Stroke Mother   . Diabetes Father   . Diabetes Mother   . Colon cancer Neg Hx   . Prostate cancer Neg Hx     BP 124/70   Pulse (!) 56   Ht 6\' 2"  (1.88 m)   Wt 214 lb (97.1 kg)   SpO2 97%   BMI 27.48 kg/m   Review of Systems He denies hypoglycemia.     Objective:   Physical Exam VITAL SIGNS:  See vs page.   GENERAL: no distress.   Pulses: dorsalis pedis intact bilat.   MSK: no deformity of the feet.   CV: no leg edema.   Skin:  no ulcer on the feet.  normal color and temp on the feet, except for vitiligo.   Neuro: sensation is intact to  touch on the feet, but decreased from normal.     A1c=10.1%    Assessment & Plan:  Type 1 DM, with polyneuropathy: worse.   Occupational status (3rd shift): in this setting, he should take the same amount of novolog with each meal, at least for now.    Patient is advised the following: Patient Instructions  check your blood sugar twice a day.  vary the time of day when you check, between before the 3 meals, and at bedtime.  also check if you have symptoms of your blood sugar being too high or too low.  please keep a record of the readings and bring it to your next appointment here.  please call us sooner if your blood sugar goes below 70, or if it stays over 150.    Please increase the novolog to 16 units 3 times a day (with each meal), and:  Reduce the basaglar to 5 units daily.  Please come back for a follow-up appointment in 6 weeks.

## 2016-04-30 ENCOUNTER — Ambulatory Visit (INDEPENDENT_AMBULATORY_CARE_PROVIDER_SITE_OTHER): Payer: 59 | Admitting: Endocrinology

## 2016-04-30 VITALS — BP 124/64 | HR 63 | Ht 74.0 in | Wt 216.0 lb

## 2016-04-30 DIAGNOSIS — Z23 Encounter for immunization: Secondary | ICD-10-CM

## 2016-04-30 DIAGNOSIS — E1142 Type 2 diabetes mellitus with diabetic polyneuropathy: Secondary | ICD-10-CM

## 2016-04-30 DIAGNOSIS — Z0000 Encounter for general adult medical examination without abnormal findings: Secondary | ICD-10-CM | POA: Diagnosis not present

## 2016-04-30 LAB — CBC WITH DIFFERENTIAL/PLATELET
Basophils Absolute: 0 K/uL (ref 0.0–0.1)
Basophils Relative: 0.6 % (ref 0.0–3.0)
Eosinophils Absolute: 0.4 K/uL (ref 0.0–0.7)
Eosinophils Relative: 8.5 % — ABNORMAL HIGH (ref 0.0–5.0)
HCT: 44 % (ref 39.0–52.0)
Hemoglobin: 14.7 g/dL (ref 13.0–17.0)
Lymphocytes Relative: 23.1 % (ref 12.0–46.0)
Lymphs Abs: 1.2 K/uL (ref 0.7–4.0)
MCHC: 33.4 g/dL (ref 30.0–36.0)
MCV: 85.4 fl (ref 78.0–100.0)
Monocytes Absolute: 0.4 K/uL (ref 0.1–1.0)
Monocytes Relative: 7.7 % (ref 3.0–12.0)
Neutro Abs: 3.2 K/uL (ref 1.4–7.7)
Neutrophils Relative %: 60.1 % (ref 43.0–77.0)
Platelets: 167 K/uL (ref 150.0–400.0)
RBC: 5.15 Mil/uL (ref 4.22–5.81)
RDW: 14.5 % (ref 11.5–15.5)
WBC: 5.3 K/uL (ref 4.0–10.5)

## 2016-04-30 LAB — BASIC METABOLIC PANEL WITH GFR
BUN: 13 mg/dL (ref 6–23)
CO2: 31 meq/L (ref 19–32)
Calcium: 9.1 mg/dL (ref 8.4–10.5)
Chloride: 106 meq/L (ref 96–112)
Creatinine, Ser: 0.59 mg/dL (ref 0.40–1.50)
GFR: 149.99 mL/min
Glucose, Bld: 238 mg/dL — ABNORMAL HIGH (ref 70–99)
Potassium: 4.1 meq/L (ref 3.5–5.1)
Sodium: 141 meq/L (ref 135–145)

## 2016-04-30 LAB — URINALYSIS, ROUTINE W REFLEX MICROSCOPIC
BILIRUBIN URINE: NEGATIVE
Hgb urine dipstick: NEGATIVE
KETONES UR: NEGATIVE
Leukocytes, UA: NEGATIVE
Nitrite: NEGATIVE
PH: 7.5 (ref 5.0–8.0)
Specific Gravity, Urine: 1.015 (ref 1.000–1.030)
TOTAL PROTEIN, URINE-UPE24: NEGATIVE
UROBILINOGEN UA: 0.2 (ref 0.0–1.0)

## 2016-04-30 LAB — HEPATIC FUNCTION PANEL
ALBUMIN: 4.2 g/dL (ref 3.5–5.2)
ALT: 20 U/L (ref 0–53)
AST: 17 U/L (ref 0–37)
Alkaline Phosphatase: 104 U/L (ref 39–117)
BILIRUBIN DIRECT: 0.1 mg/dL (ref 0.0–0.3)
TOTAL PROTEIN: 6.4 g/dL (ref 6.0–8.3)
Total Bilirubin: 0.7 mg/dL (ref 0.2–1.2)

## 2016-04-30 LAB — LIPID PANEL
Cholesterol: 171 mg/dL (ref 0–200)
HDL: 67.2 mg/dL
LDL Cholesterol: 90 mg/dL (ref 0–99)
NonHDL: 103.46
Total CHOL/HDL Ratio: 3
Triglycerides: 69 mg/dL (ref 0.0–149.0)
VLDL: 13.8 mg/dL (ref 0.0–40.0)

## 2016-04-30 LAB — PSA: PSA: 0.6 ng/mL (ref 0.10–4.00)

## 2016-04-30 LAB — TSH: TSH: 2.62 u[IU]/mL (ref 0.35–4.50)

## 2016-04-30 NOTE — Progress Notes (Signed)
Subjective:    Patient ID: Daniel [name], male    DOB: [birth year], 58 y.o.   MRN: [id number]  HPI Pt returns for f/u of diabetes mellitus: DM type: 1 is dx'ed, due to vitiligo, lean body habitus, and failure of oral rx. Dx'ed: 2010 Complications: polyneuropathy. Therapy: insulin since 2017.   DKA: never.  Severe hypoglycemia: never.  Pancreatitis: never.   Other: he takes multiple daily injections. he works 3rd shift.   Interval history: no cbg record, but states cbg's are in the low-100's.  There is no trend throughout the day.  pt states he feels well in general.  Past Medical History:  Diagnosis Date  . Diabetes mellitus 05/17/07  . DJD (degenerative joint disease) of knee    Right knee, end stage, injected 09/2014, Dr. Jerl Santosalldorf  . Flexor tenosynovitis of finger    Left, Dr. Jerl Santosalldorf  . Hyperlipidemia   . Knee pain    w/u included a MRI of the knee (-) and back, dx w/ Spinal Stenosis (believed to be causing knee pain)  . Normal cardiac stress test 09/2007  . Shoulder impingement    Right, Dr. Jerl Santosalldorf  . Vitiligo    dx in the 90s    Past Surgical History:  Procedure Laterality Date  . FINGER SURGERY    . SHOULDER SURGERY  09-2010   Left, arthroscopy    Social History   Social History  . Marital status: Married    Spouse name: N/A  . Number of children: 2  . 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 Smoking status: Former Smoker    Packs/day: 0.10    Types: Cigarettes    Quit date: 05/25/2011  . Smokeless tobacco: Never Used  . Alcohol use Yes     Comment: rare  . Drug use: No  . Sexual activity: Not on file   Other Topics Concern  . Not on file   Social History Narrative   From [place]     Current Outpatient Prescriptions on File Prior to Visit  Medication Sig Dispense Refill  . insulin aspart (NOVOLOG FLEXPEN) 100 UNIT/ML FlexPen Inject 16 Units into the skin 3 (three) times daily  with meals. And pen needles 4/day 30 mL 11  . Insulin Glargine (BASAGLAR KWIKPEN) 100 UNIT/ML SOPN Inject 0.05 mLs (5 Units total) into the skin daily. 5 pen 11  . ONE TOUCH LANCETS MISC by Does not apply route 4 (four) times daily.       No current facility-administered medications on file prior to visit.     No Known Allergies  Family History  Problem Relation Age of Onset  . Coronary artery disease Father     mid 6980s  . Hypertension Mother   . Stroke Mother   . Diabetes Father   . Diabetes Mother   . Colon cancer Neg Hx   . Prostate cancer Neg Hx     BP 124/64   Pulse 63   Ht 6\' 2"  (1.88 m)   Wt 216 lb (98 kg)   SpO2 98%   BMI 27.73 kg/m    Review of Systems He denies hypoglycemia    Objective:   Physical Exam VITAL SIGNS:  See vs page.   GENERAL: no distress.   Pulses: dorsalis pedis intact bilat.   MSK: no deformity of the feet.   CV: no leg edema.   Skin:  no ulcer on  the feet.  normal color and temp on the feet, except for vitiligo.   Neuro: sensation is intact to touch on the feet, but decreased from normal.    Lab Results  Component Value Date   CHOL 171 04/30/2016   HDL 67.20 04/30/2016   LDLCALC 90 04/30/2016   TRIG 69.0 04/30/2016   CHOLHDL 3 04/30/2016       Assessment & Plan:  Type 1 DM: uncertain control.  Check fructosamine Dyslipidemia: well-controlled off rx.  Pt can stay off for now. Occupational status (3rd shift) in this setting, he needs same novolog with each meal.

## 2016-04-30 NOTE — Patient Instructions (Addendum)
check your blood sugar twice a day.  vary the time of day when you check, between before the 3 meals, and at bedtime.  also check if you have symptoms of your blood sugar being too high or too low.  please keep a record of the readings and bring it to your next appointment here.  please call us sooner if your blood sugar goes below 70, or if it stays over 150.    Please continue the same insulins blood tests are requested for you today.  We'll let you know about the results.  Please come back for a follow-up appointment in 3 months.

## 2016-05-03 LAB — FRUCTOSAMINE: FRUCTOSAMINE: 362 umol/L — AB (ref 190–270)

## 2016-07-28 ENCOUNTER — Encounter: Payer: Self-pay | Admitting: Endocrinology

## 2016-07-28 ENCOUNTER — Ambulatory Visit (INDEPENDENT_AMBULATORY_CARE_PROVIDER_SITE_OTHER): Payer: 59 | Admitting: Endocrinology

## 2016-07-28 VITALS — BP 126/84 | HR 62 | Ht 74.0 in | Wt 219.0 lb

## 2016-07-28 DIAGNOSIS — E1142 Type 2 diabetes mellitus with diabetic polyneuropathy: Secondary | ICD-10-CM | POA: Diagnosis not present

## 2016-07-28 LAB — POCT GLYCOSYLATED HEMOGLOBIN (HGB A1C): HEMOGLOBIN A1C: 9

## 2016-07-28 MED ORDER — INSULIN ASPART 100 UNIT/ML FLEXPEN: 18.0000 [IU] | PEN_INJECTOR | Freq: Three times a day (TID) | SUBCUTANEOUS | 11 refills | Status: DC

## 2016-07-28 NOTE — Patient Instructions (Addendum)
check your blood sugar twice a day.  vary the time of day when you check, between before the 3 meals, and at bedtime.  also check if you have symptoms of your blood sugar being too high or too low.  please keep a record of the readings and bring it to your next appointment here.  please call us sooner if your blood sugar goes below 70, or if it stays over 150.    Please continue the same lantus, and: Increase the novolog to 18 units 3 times a day (just before each meal).  Keep the pen with you, so you can take it when you eat.   Please come back for a follow-up appointment in 3 months.   Please see Bonita QuinLinda, to consider an insulin pump.

## 2016-07-28 NOTE — Progress Notes (Signed)
Subjective:    Patient ID: Daniel [name], male    DOB: [birth year], 61 y.o.   MRN: [id number]  HPI Pt returns for f/u of diabetes mellitus: DM type: 1 is dx'ed, due to vitiligo, lean body habitus, and failure of oral rx. Dx'ed: 2010 Complications: polyneuropathy. Therapy: insulin since 2017.   DKA: never.  Severe hypoglycemia: never.  Pancreatitis: never.   Other: he takes multiple daily injections. he works 3rd shift.   Interval history: no cbg record, but states cbg's are in the low-100's.  There is no trend throughout the day.  pt states he feels well in general.  He says he misses the insulin approx 1 dose qod.   Past Medical History:  Diagnosis Date  . Diabetes mellitus 05/17/07  . DJD (degenerative joint disease) of knee    Right knee, end stage, injected 09/2014, Dr. Jerl Santosalldorf  . Flexor tenosynovitis of finger    Left, Dr. Jerl Santosalldorf  . Hyperlipidemia   . Knee pain    w/u included a MRI of the knee (-) and back, dx w/ Spinal Stenosis (believed to be causing knee pain)  . Normal cardiac stress test 09/2007  . Shoulder impingement    Right, Dr. Jerl Santosalldorf  . Vitiligo    dx in the 90s    Past Surgical History:  Procedure Laterality Date  . FINGER SURGERY    . SHOULDER SURGERY  09-2010   Left, arthroscopy    Social History   Social History  . Marital status: Married    Spouse name: N/A  . Number of children: 2  . 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 Smoking status: Former Smoker    Packs/day: 0.10    Types: Cigarettes    Quit date: 05/25/2011  . Smokeless tobacco: Never Used  . Alcohol use Yes     Comment: rare  . Drug use: No  . Sexual activity: Not on file   Other Topics Concern  . Not on file   Social History Narrative   From [place]     Current Outpatient Prescriptions on File Prior to Visit  Medication Sig Dispense Refill  . Insulin Glargine (BASAGLAR KWIKPEN) 100 UNIT/ML SOPN  Inject 0.05 mLs (5 Units total) into the skin daily. 5 pen 11  . ONE TOUCH LANCETS MISC by Does not apply route 4 (four) times daily.       No current facility-administered medications on file prior to visit.     No Known Allergies  Family History  Problem Relation Age of Onset  . Coronary artery disease Father     mid 8980s  . Hypertension Mother   . Stroke Mother   . Diabetes Father   . Diabetes Mother   . Colon cancer Neg Hx   . Prostate cancer Neg Hx     BP 126/84   Pulse 62   Ht 6\' 2"  (1.88 m)   Wt 219 lb (99.3 kg)   SpO2 97%   BMI 28.12 kg/m    Review of Systems He denies hypoglycemia.      Objective:   Physical Exam VITAL SIGNS:  See vs page.   GENERAL: no distress.   Pulses: dorsalis pedis intact bilat.   MSK: no deformity of the feet.   CV: no leg edema.   Skin:  no ulcer on the feet.  normal color and temp on the feet, except for vitiligo.  Neuro: sensation is intact to touch on the feet, but decreased from normal.   A1c=9.0%    Assessment & Plan:  Type 1 DM: poor control Noncompliance with cbg recording and insulin, worse.  We discussed risks.  Patient is advised the following: Patient Instructions  check your blood sugar twice a day.  vary the time of day when you check, between before the 3 meals, and at bedtime.  also check if you have symptoms of your blood sugar being too high or too low.  please keep a record of the readings and bring it to your next appointment here.  please call us sooner if your blood sugar goes below 70, or if it stays over 150.    Please continue the same lantus, and: Increase the novolog to 18 units 3 times a day (just before each meal).  Keep the pen with you, so you can take it when you eat.   Please come back for a follow-up appointment in 3 months.   Please see Bonita Quin, to consider an insulin pump.

## 2016-08-10 ENCOUNTER — Encounter: Payer: Self-pay | Admitting: Nutrition

## 2016-09-29 ENCOUNTER — Other Ambulatory Visit: Payer: Self-pay | Admitting: Endocrinology

## 2016-10-16 ENCOUNTER — Other Ambulatory Visit: Payer: Self-pay | Admitting: Endocrinology

## 2016-10-29 ENCOUNTER — Encounter: Payer: Self-pay | Admitting: Endocrinology

## 2016-10-29 ENCOUNTER — Ambulatory Visit (INDEPENDENT_AMBULATORY_CARE_PROVIDER_SITE_OTHER): Payer: 59 | Admitting: Endocrinology

## 2016-10-29 VITALS — BP 124/74 | HR 60 | Ht 74.0 in | Wt 228.0 lb

## 2016-10-29 DIAGNOSIS — E1142 Type 2 diabetes mellitus with diabetic polyneuropathy: Secondary | ICD-10-CM

## 2016-10-29 LAB — POCT GLYCOSYLATED HEMOGLOBIN (HGB A1C): HEMOGLOBIN A1C: 9.4

## 2016-10-29 MED ORDER — INSULIN ASPART 100 UNIT/ML FLEXPEN: 21.0000 [IU] | PEN_INJECTOR | Freq: Three times a day (TID) | SUBCUTANEOUS | 11 refills | Status: DC

## 2016-10-29 NOTE — Patient Instructions (Addendum)
check your blood sugar twice a day.  vary the time of day when you check, between before the 3 meals, and at bedtime.  also check if you have symptoms of your blood sugar being too high or too low.  please keep a record of the readings and bring it to your next appointment here.  please call us sooner if your blood sugar goes below 70, or if it stays over 150.    Please continue the same basaglar, and: Increase the novolog to 21 units 3 times a day (just before each meal).  Keep the pen with you, so you can take it when you eat.   Please come back for a follow-up appointment in 3 months.

## 2016-10-29 NOTE — Progress Notes (Signed)
Subjective:    Patient ID: Daniel [name], male    DOB: Aug 07, 1955, 61 y.o.   MRN: [id number]  HPI Pt returns for f/u of diabetes mellitus: DM type: 1 is dx'ed, due to vitiligo, lean body habitus, and failure of oral rx.   Dx'ed: 2010 Complications: polyneuropathy.  Therapy: insulin since 2017.   DKA: never.  Severe hypoglycemia: never.  Pancreatitis: never.   Other: he takes multiple daily injections. he works 3rd shift.   Interval history: no cbg record, but states cbg's vary from 85-180.  It is highest if he has recently eaten, despite taking the novolog.  pt states he feels well in general.  He says he never misses the insulin. He did not keep appt to learn about pump.  Past Medical History:  Diagnosis Date  . Diabetes mellitus 05/17/07  . DJD (degenerative joint disease) of knee    Right knee, end stage, injected 09/2014, Dr. Jerl Santos  . Flexor tenosynovitis of finger    Left, Dr. Jerl Santos  . Hyperlipidemia   . Knee pain    w/u included a MRI of the knee (-) and back, dx w/ Spinal Stenosis (believed to be causing knee pain)  . Normal cardiac stress test 09/2007  . Shoulder impingement    Right, Dr. Jerl Santos  . Vitiligo    dx in the 90s    Past Surgical History:  Procedure Laterality Date  . FINGER SURGERY    . SHOULDER SURGERY  09-2010   Left, arthroscopy    Social History   Social History  . Marital status: Married    Spouse name: N/A  . Number of children: 2  . 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 Smoking status: Former Smoker    Packs/day: 0.10    Types: Cigarettes    Quit date: 05/25/2011  . Smokeless tobacco: Never Used  . Alcohol use Yes     Comment: rare  . Drug use: No  . Sexual activity: Not on file   Other Topics Concern  . Not on file   Social History Narrative   From [place]----     Current Outpatient Prescriptions on File Prior to Visit  Medication Sig Dispense Refill    . B-D UF III MINI PEN NEEDLES 31G X 5 MM MISC USE AS DIRECTED TO INJECT INSULIN THREE TIMES DAILY 100 each 0  . Insulin Glargine (BASAGLAR KWIKPEN) 100 UNIT/ML SOPN Inject 0.05 mLs (5 Units total) into the skin daily. 5 pen 11  . ONE TOUCH LANCETS MISC by Does not apply route 4 (four) times daily.       No current facility-administered medications on file prior to visit.     No Known Allergies  Family History  Problem Relation Age of Onset  . Coronary artery disease Father        mid 58s  . Hypertension Mother   . Stroke Mother   . Diabetes Father   . Diabetes Mother   . Colon cancer Neg Hx   . Prostate cancer Neg Hx     BP 124/74   Pulse 60   Ht 6\' 2"  (1.88 m)   Wt 228 lb (103.4 kg)   SpO2 96%   BMI 29.27 kg/m    Review of Systems He has gained weight.      Objective:   Physical Exam VITAL SIGNS:  See vs page.   GENERAL: no distress.  Pulses: dorsalis pedis intact bilat.   MSK: no deformity of the feet.   CV: trace bilat leg edema.   Skin:  no ulcer on the feet.  normal color and temp on the feet, except for vitiligo.   Neuro: sensation is intact to touch on the feet, but decreased from normal.    A1c=9.4%    Assessment & Plan:  Type 1 DM, with polynropathy: worse. Occupational status: this complicates the rx of DM.  Patient Instructions  check your blood sugar twice a day.  vary the time of day when you check, between before the 3 meals, and at bedtime.  also check if you have symptoms of your blood sugar being too high or too low.  please keep a record of the readings and bring it to your next appointment here.  please call us sooner if your blood sugar goes below 70, or if it stays over 150.    Please continue the same basaglar, and: Increase the novolog to 21 units 3 times a day (just before each meal).  Keep the pen with you, so you can take it when you eat.   Please come back for a follow-up appointment in 3 months.

## 2016-11-22 ENCOUNTER — Telehealth: Payer: Self-pay | Admitting: Endocrinology

## 2016-11-22 NOTE — Telephone Encounter (Signed)
Patient called to make an appointment for a sore on his leg. I was advised by Raynelle FanningJulie that the patient needs to see his PCP since Dr. Everardo AllEllison is not the PCP. Call patient to get scheduled with Dr. Everardo AllEllison also if necessary. Patient stated he would make an appointment with the PCP.

## 2016-11-22 NOTE — Telephone Encounter (Signed)
See message and please advise if patient needs to be scheduled with us as well.

## 2016-11-25 ENCOUNTER — Encounter: Payer: Self-pay | Admitting: Internal Medicine

## 2016-11-25 ENCOUNTER — Ambulatory Visit (INDEPENDENT_AMBULATORY_CARE_PROVIDER_SITE_OTHER): Payer: 59 | Admitting: Internal Medicine

## 2016-11-25 VITALS — BP 122/70 | HR 74 | Temp 98.1°F | Resp 14 | Ht 74.0 in | Wt 227.5 lb

## 2016-11-25 DIAGNOSIS — Z23 Encounter for immunization: Secondary | ICD-10-CM

## 2016-11-25 DIAGNOSIS — L989 Disorder of the skin and subcutaneous tissue, unspecified: Secondary | ICD-10-CM | POA: Diagnosis not present

## 2016-11-25 DIAGNOSIS — Z09 Encounter for follow-up examination after completed treatment for conditions other than malignant neoplasm: Secondary | ICD-10-CM | POA: Insufficient documentation

## 2016-11-25 MED ORDER — BETAMETHASONE DIPROPIONATE 0.05 % EX CREA: TOPICAL_CREAM | Freq: Two times a day (BID) | CUTANEOUS | 1 refills | Status: DC

## 2016-11-25 NOTE — Assessment & Plan Note (Signed)
Skin lesion, likely psoriasis. Recommend high potency steroids twice a day for 2 weeks then as needed. Call if not better. Primary care: Td today RTC one year, CPX

## 2016-11-25 NOTE — Patient Instructions (Signed)
   GO TO THE FRONT DESK Schedule your next appointment for a  physical exam in one year   PONGASE LA CREMA 2 VECES AL DIA POR 2 SEMANAS. SI NO MEJORA POR FAVOR LLAMENOS

## 2016-11-25 NOTE — Progress Notes (Signed)
Subjective:    Patient ID: Daniel [name], male    DOB: Oct 03, 1955, 61 y.o.   MRN: [id number]  DOS:  11/25/2016 Type of visit - description : Acute visit Interval history: Note the skin lesion on the left leg about 3 weeks ago. No itching, no blister, no previous lesions similar to this one.   Review of Systems   Past Medical History:  Diagnosis Date  . Diabetes mellitus 05/17/07  . DJD (degenerative joint disease) of knee    Right knee, end stage, injected 09/2014, Dr. Jerl Santosalldorf  . Flexor tenosynovitis of finger    Left, Dr. Jerl Santosalldorf  . Hyperlipidemia   . Knee pain    w/u included a MRI of the knee (-) and back, dx w/ Spinal Stenosis (believed to be causing knee pain)  . Normal cardiac stress test 09/2007  . Shoulder impingement    Right, Dr. Jerl Santosalldorf  . Vitiligo    dx in the 90s    Past Surgical History:  Procedure Laterality Date  . FINGER SURGERY    . SHOULDER SURGERY  09-2010   Left, arthroscopy    Social History   Social History  . Marital status: Married    Spouse name: N/A  . Number of children: 2  . 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 Smoking status: Former Smoker    Packs/day: 0.10    Types: Cigarettes    Quit date: 05/25/2011  . Smokeless tobacco: Never Used  . Alcohol use Yes     Comment: rare  . Drug use: No  . Sexual activity: Not on file   Other Topics Concern  . Not on file   Social History Narrative   From [place]       Allergies as of 11/25/2016   No Known Allergies     Medication List       Accurate as of 11/25/16  1:43 PM. Always use your most recent med list.          B-D UF III MINI PEN NEEDLES 31G X 5 MM Misc Generic drug:  Insulin Pen Needle USE AS DIRECTED TO INJECT INSULIN THREE TIMES DAILY   BASAGLAR KWIKPEN 100 UNIT/ML Sopn Inject 0.05 mLs (5 Units total) into the skin daily.   betamethasone dipropionate 0.05 % cream Commonly known as:   DIPROLENE Apply topically 2 (two) times daily.   insulin aspart 100 UNIT/ML FlexPen Commonly known as:  NOVOLOG FLEXPEN Inject 21 Units into the skin 3 (three) times daily with meals. And pen needles 4/day   ONE TOUCH LANCETS Misc by Does not apply route 4 (four) times daily.          Objective:   Physical Exam  Skin:      BP 122/70 (BP Location: Left Arm, Patient Position: Sitting, Cuff Size: Normal)   Pulse 74   Temp 98.1 F (36.7 C) (Oral)   Resp 14   Ht 6\' 2"  (1.88 m)   Wt 227 lb 8 oz (103.2 kg)   SpO2 98%   BMI 29.21 kg/m  General:   Well developed, well nourished . NAD.  HEENT:  Normocephalic . Face symmetric, atraumatic Neurologic:  alert & oriented X3.  Speech normal, gait appropriate for age and unassisted Psych--  Cognition and judgment appear intact.  Cooperative with normal attention span and concentration.  Behavior appropriate. No anxious or depressed appearing.  Assessment & Plan:    Assessment Diabetes, Dr. Everardo All Hyperlipidemia Vitiligo DJD Normal stress test 2009  Plan: Skin lesion, likely psoriasis. Recommend high potency steroids twice a day for 2 weeks then as needed. Call if not better. Primary care: Td today RTC one year, CPX

## 2016-11-25 NOTE — Progress Notes (Signed)
Pre visit review using our clinic review tool, if applicable. No additional management support is needed unless otherwise documented below in the visit note. 

## 2017-01-08 ENCOUNTER — Other Ambulatory Visit: Payer: Self-pay | Admitting: Endocrinology

## 2017-01-31 ENCOUNTER — Ambulatory Visit: Payer: Self-pay | Admitting: Endocrinology

## 2017-01-31 DIAGNOSIS — Z0289 Encounter for other administrative examinations: Secondary | ICD-10-CM

## 2017-02-21 ENCOUNTER — Ambulatory Visit (INDEPENDENT_AMBULATORY_CARE_PROVIDER_SITE_OTHER): Payer: Self-pay | Admitting: Endocrinology

## 2017-02-21 ENCOUNTER — Encounter: Payer: Self-pay | Admitting: Endocrinology

## 2017-02-21 VITALS — BP 118/74 | HR 54 | Wt 230.2 lb

## 2017-02-21 DIAGNOSIS — E1142 Type 2 diabetes mellitus with diabetic polyneuropathy: Secondary | ICD-10-CM

## 2017-02-21 LAB — POCT GLYCOSYLATED HEMOGLOBIN (HGB A1C): Hemoglobin A1C: 9.1

## 2017-02-21 MED ORDER — INSULIN ASPART 100 UNIT/ML FLEXPEN: 26.0000 [IU] | PEN_INJECTOR | Freq: Three times a day (TID) | SUBCUTANEOUS | 11 refills | Status: DC

## 2017-02-21 NOTE — Patient Instructions (Addendum)
check your blood sugar twice a day.  vary the time of day when you check, between before the 3 meals, and at bedtime.  also check if you have symptoms of your blood sugar being too high or too low.  please keep a record of the readings and bring it to your next appointment here.  please call us sooner if your blood sugar goes below 70, or if it stays over 150.    Please stop taking the basaglar, and: Increase the novolog to 26 units 3 times a day (just before each meal).  Keep the pen with you, so you can take it when you eat.   Please come back for a follow-up appointment in 2 months.

## 2017-02-21 NOTE — Progress Notes (Signed)
Subjective:    Patient ID: Daniel [name], male    DOB: Aug 18, 1955, 61 y.o.   MRN: [id number]  HPI Pt returns for f/u of diabetes mellitus: DM type: 1 is dx'ed, due to vitiligo, lean body habitus, and failure of oral rx.   Dx'ed: 2010 Complications: polyneuropathy.  Therapy: insulin since 2017.   DKA: never.  Severe hypoglycemia: never.  Pancreatitis: never.   Other: he takes multiple daily injections. he works 3rd shift; he declines pump and continuous glucose monitor. Interval history: he has mild hypoglycemia approx 2-3 times per week.  This happens with exercise, or with a missed meal (even if he also skips novolog).  no cbg record, but states cbg's highest after eating.  He says he rarely misses the insulin Past Medical History:  Diagnosis Date  . Diabetes mellitus 05/17/07  . DJD (degenerative joint disease) of knee    Right knee, end stage, injected 09/2014, Dr. Jerl Santos  . Flexor tenosynovitis of finger    Left, Dr. Jerl Santos  . Hyperlipidemia   . Knee pain    w/u included a MRI of the knee (-) and back, dx w/ Spinal Stenosis (believed to be causing knee pain)  . Normal cardiac stress test 09/2007  . Shoulder impingement    Right, Dr. Jerl Santos  . Vitiligo    dx in the 90s    Past Surgical History:  Procedure Laterality Date  . FINGER SURGERY    . SHOULDER SURGERY  09-2010   Left, arthroscopy    Social History   Social History  . Marital status: Married    Spouse name: N/A  . Number of children: 2  . 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 Smoking status: Former Smoker    Packs/day: 0.10    Types: Cigarettes    Quit date: 05/25/2011  . Smokeless tobacco: Never Used  . Alcohol use Yes     Comment: rare  . Drug use: No  . Sexual activity: Not on file   Other Topics Concern  . Not on file   Social History Narrative   From [place]----     Current Outpatient Prescriptions on File Prior to  Visit  Medication Sig Dispense Refill  . B-D UF III MINI PEN NEEDLES 31G X 5 MM MISC USE AS DIRECTED TO INJECT INSULIN THREE TIMES DAILY 100 each 0  . betamethasone dipropionate (DIPROLENE) 0.05 % cream Apply topically 2 (two) times daily. 30 g 1  . ONE TOUCH LANCETS MISC by Does not apply route 4 (four) times daily.       No current facility-administered medications on file prior to visit.     No Known Allergies  Family History  Problem Relation Age of Onset  . Coronary artery disease Father        mid 89s  . Diabetes Father   . Hypertension Mother   . Stroke Mother   . Diabetes Mother   . Colon cancer Neg Hx   . Prostate cancer Neg Hx     BP 118/74   Pulse (!) 54   Wt 230 lb 3.2 oz (104.4 kg)   SpO2 96%   BMI 29.56 kg/m   Review of Systems He denies LOC.     Objective:   Physical Exam VITAL SIGNS:  See vs page.   GENERAL: no distress.   Pulses: dorsalis pedis intact bilat.   MSK: no deformity  of the feet.   CV: trace bilat leg edema.   Skin:  no ulcer on the feet.  normal color and temp on the feet, except for vitiligo.   Neuro: sensation is intact to touch on the feet, but decreased from normal.   Lab Results  Component Value Date   HGBA1C 9.1 02/21/2017      Assessment & Plan:  Insulin-requiring type 2 DM, with polyneuropathy: we discussed insulin pump, and I showed him one--he declines.  Hypoglycemia: d/c basaglar. occupational status: due to working 3rd shift, he can't vary novolog dosage according to time of day.    Patient Instructions  check your blood sugar twice a day.  vary the time of day when you check, between before the 3 meals, and at bedtime.  also check if you have symptoms of your blood sugar being too high or too low.  please keep a record of the readings and bring it to your next appointment here.  please call us sooner if your blood sugar goes below 70, or if it stays over 150.    Please stop taking the basaglar, and: Increase the novolog  to 26 units 3 times a day (just before each meal).  Keep the pen with you, so you can take it when you eat.   Please come back for a follow-up appointment in 2 months.

## 2017-03-07 ENCOUNTER — Telehealth: Payer: Self-pay

## 2017-03-07 NOTE — Telephone Encounter (Signed)
Patient's wife called and stated he is having headaches and his blood sugar has been in 400's during the day- he feels he is getting worse since he stopped taking basaglar- headaches have been really bad since he stopped taking it

## 2017-03-07 NOTE — Telephone Encounter (Signed)
Please let us know if you have been on steroids Resume basaglar, 10 units qd. Needs f/u of this week. If n/v or sob: go to ER

## 2017-03-08 NOTE — Telephone Encounter (Signed)
Called and spoke with patient's wife since patient was sleeping & works 7-7pm shifts. She stated she would have him resume the basaglar at 10 units qd for now. He did have some left from where he took it previously. I also made him an appt for 2:45 Friday.

## 2017-03-11 ENCOUNTER — Encounter: Payer: Self-pay | Admitting: Endocrinology

## 2017-03-11 ENCOUNTER — Ambulatory Visit: Payer: 59 | Admitting: Endocrinology

## 2017-03-11 VITALS — BP 122/80 | HR 60 | Wt 233.0 lb

## 2017-03-11 DIAGNOSIS — E1142 Type 2 diabetes mellitus with diabetic polyneuropathy: Secondary | ICD-10-CM | POA: Diagnosis not present

## 2017-03-11 MED ORDER — BASAGLAR KWIKPEN 100 UNIT/ML ~~LOC~~ SOPN: 5.0000 [IU] | PEN_INJECTOR | Freq: Every day | SUBCUTANEOUS | 11 refills | Status: DC

## 2017-03-11 NOTE — Progress Notes (Signed)
Subjective:    Patient ID: Daniel [name], male    DOB: 1955/07/23, 61 y.o.   MRN: [id number]  HPI Pt returns for f/u of diabetes mellitus: DM type: 1 is dx'ed, due to vitiligo, lean body habitus, and failure of oral rx.   Dx'ed: 2010 Complications: polyneuropathy.  Therapy: insulin since 2017.   DKA: never.  Severe hypoglycemia: never.  Pancreatitis: never.   Other: he takes multiple daily injections. he works 3rd shift; he declines pump and continuous glucose monitor. Interval history: Pt called a few days ago, saying cbg's had increased to 420.  He is unable to cite reason for the increase, such as illness or steroids.  He was advised to re-add basaglar.   Since then, cbg's vary from 65-250.  It is in general higher as the day goes on.   Past Medical History:  Diagnosis Date  . Diabetes mellitus 05/17/07  . DJD (degenerative joint disease) of knee    Right knee, end stage, injected 09/2014, Dr. Jerl Santos  . Flexor tenosynovitis of finger    Left, Dr. Jerl Santos  . Hyperlipidemia   . Knee pain    w/u included a MRI of the knee (-) and back, dx w/ Spinal Stenosis (believed to be causing knee pain)  . Normal cardiac stress test 09/2007  . Shoulder impingement    Right, Dr. Jerl Santos  . Vitiligo    dx in the 90s    Past Surgical History:  Procedure Laterality Date  . FINGER SURGERY    . SHOULDER SURGERY  09-2010   Left, arthroscopy    Social History   Social History  . Marital status: Married    Spouse name: N/A  . Number of children: 2  . 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 Smoking status: Former Smoker    Packs/day: 0.10    Types: Cigarettes    Quit date: 05/25/2011  . Smokeless tobacco: Never Used  . Alcohol use Yes     Comment: rare  . Drug use: No  . Sexual activity: Not on file   Other Topics Concern  . Not on file   Social History Narrative   From [place]----     Current Outpatient  Prescriptions on File Prior to Visit  Medication Sig Dispense Refill  . B-D UF III MINI PEN NEEDLES 31G X 5 MM MISC USE AS DIRECTED TO INJECT INSULIN THREE TIMES DAILY 100 each 0  . betamethasone dipropionate (DIPROLENE) 0.05 % cream Apply topically 2 (two) times daily. 30 g 1  . insulin aspart (NOVOLOG FLEXPEN) 100 UNIT/ML FlexPen Inject 26 Units into the skin 3 (three) times daily with meals. And pen needles 4/day 30 mL 11  . ONE TOUCH LANCETS MISC by Does not apply route 4 (four) times daily.       No current facility-administered medications on file prior to visit.     No Known Allergies  Family History  Problem Relation Age of Onset  . Coronary artery disease Father        mid 70s  . Diabetes Father   . Hypertension Mother   . Stroke Mother   . Diabetes Mother   . Colon cancer Neg Hx   . Prostate cancer Neg Hx     BP 122/80   Pulse 60   Wt 233 lb (105.7 kg)   SpO2 98%   BMI 29.92 kg/m  Review of Systems He denies hypoglycemia    Objective:   Physical Exam VITAL SIGNS:  See vs page.   GENERAL: no distress.   Pulses: dorsalis pedis intact bilat.   MSK: no deformity of the feet.   CV: trace bilat leg edema.   Skin:  no ulcer on the feet.  normal color and temp on the feet, except for vitiligo.   Neuro: sensation is intact to touch on the feet, but decreased from normal.       Assessment & Plan:  Insulin-requiring type 2 DM, with polyneuropthy: worse.  Vitiligo and lean body habitus: DM is transitioning to type 1.  This prob explains worsening glycemic control.  We discussed.  He again declines pump and continuous glucose monitor.   Patient Instructions  check your blood sugar twice a day.  vary the time of day when you check, between before the 3 meals, and at bedtime.  also check if you have symptoms of your blood sugar being too high or too low.  please keep a record of the readings and bring it to your next appointment here.  please call us sooner if your  blood sugar goes below 70, or if it stays over 150.    Please reduce the basaglar to 5 units each evening, and: continue the novolog: 26 units 3 times a day (just before each meal).  Keep the pen with you, so you can take it when you eat.   Please come back for a follow-up appointment in 3 months. Please call or message us next week, to tell us how the blood sugar is doing.

## 2017-03-11 NOTE — Patient Instructions (Addendum)
check your blood sugar twice a day.  vary the time of day when you check, between before the 3 meals, and at bedtime.  also check if you have symptoms of your blood sugar being too high or too low.  please keep a record of the readings and bring it to your next appointment here.  please call us sooner if your blood sugar goes below 70, or if it stays over 150.    Please reduce the basaglar to 5 units each evening, and: continue the novolog: 26 units 3 times a day (just before each meal).  Keep the pen with you, so you can take it when you eat.   Please come back for a follow-up appointment in 3 months. Please call or message us next week, to tell us how the blood sugar is doing.

## 2017-04-16 ENCOUNTER — Other Ambulatory Visit: Payer: Self-pay | Admitting: Endocrinology

## 2017-04-25 ENCOUNTER — Ambulatory Visit: Payer: Self-pay | Admitting: Endocrinology

## 2017-07-04 ENCOUNTER — Other Ambulatory Visit: Payer: Self-pay

## 2017-07-04 ENCOUNTER — Telehealth: Payer: Self-pay | Admitting: Endocrinology

## 2017-07-04 MED ORDER — INSULIN LISPRO 100 UNIT/ML ~~LOC~~ SOLN: SUBCUTANEOUS | 11 refills | Status: DC

## 2017-07-04 NOTE — Telephone Encounter (Signed)
I have sent to pharmacy for humalog.

## 2017-07-04 NOTE — Telephone Encounter (Signed)
Pt takes insulin aspart (NOVOLOG FLEXPEN) 100 UNIT/ML FlexPen Pt insurance has changed and will only cover HUMALOG Pt would like to see if they can switch to this.   Please advise

## 2017-07-27 ENCOUNTER — Ambulatory Visit: Payer: Managed Care, Other (non HMO) | Admitting: Endocrinology

## 2017-07-27 ENCOUNTER — Encounter: Payer: Self-pay | Admitting: Endocrinology

## 2017-07-27 VITALS — BP 112/78 | HR 57 | Wt 228.6 lb

## 2017-07-27 DIAGNOSIS — E1142 Type 2 diabetes mellitus with diabetic polyneuropathy: Secondary | ICD-10-CM | POA: Diagnosis not present

## 2017-07-27 LAB — POCT GLYCOSYLATED HEMOGLOBIN (HGB A1C): Hemoglobin A1C: 11.1

## 2017-07-27 MED ORDER — INSULIN LISPRO 100 UNIT/ML ~~LOC~~ SOLN: 5.0000 [IU] | Freq: Three times a day (TID) | SUBCUTANEOUS | 11 refills | Status: DC

## 2017-07-27 MED ORDER — INSULIN DEGLUDEC 100 UNIT/ML ~~LOC~~ SOPN: 60.0000 [IU] | PEN_INJECTOR | Freq: Every day | SUBCUTANEOUS | 11 refills | Status: DC

## 2017-07-27 NOTE — Patient Instructions (Addendum)
check your blood sugar twice a day.  vary the time of day when you check, between before the 3 meals, and at bedtime.  also check if you have symptoms of your blood sugar being too high or too low.  please keep a record of the readings and bring it to your next appointment here.  please call us sooner if your blood sugar goes below 70, or if it stays over 150.    Please change the insulin to those listed below.   Please come back for a follow-up appointment in 2 months.

## 2017-07-27 NOTE — Progress Notes (Signed)
Subjective:    Patient ID: Daniel [name], male    DOB: [birth year], 62 y.o.   MRN: [id number]  HPI Pt returns for f/u of diabetes mellitus: DM type: 1 is dx'ed, due to vitiligo, lean body habitus, and failure of oral rx.   Dx'ed: 2010 Complications: polyneuropathy.  Therapy: insulin since 2017.   DKA: never.  Severe hypoglycemia: never.  Pancreatitis: never.   Other: he takes multiple daily injections. he works 3rd shift; he declines pump and continuous glucose monitor.   Interval history: no cbg record, but states cbg's vary widely.  He seldom has hypoglycemia, and these episodes are mild. pt states he feels well in general. Past Medical History:  Diagnosis Date  . Diabetes mellitus 05/17/07  . DJD (degenerative joint disease) of knee    Right knee, end stage, injected 09/2014, Dr. Jerl [name]  . Flexor tenosynovitis of finger    Left, Dr. Jerl [name]  . Hyperlipidemia   . Knee pain    w/u included a MRI of the knee (-) and back, dx w/ Spinal Stenosis (believed to be causing knee pain)  . Normal cardiac stress test 09/2007  . Shoulder impingement    Right, Dr. Jerl [name]  . Vitiligo    dx in the 90s    Past Surgical History:  Procedure Laterality Date  . FINGER SURGERY    . SHOULDER SURGERY  09-2010   Left, arthroscopy    Social History   Socioeconomic History  . Marital status: Married    Spouse name: Not on file  . Number of children: 2  . Years of education: Not on file  . Highest education level: Not on file  Social Needs  . Financial resource strain: Not on file  . Food insecurity - worry: Not on file  . Food insecurity - inability: Not on file  . Transportation needs - medical: Not on file  . Transportation needs - non-medical: Not on file  Occupational History  . 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 Smoking status: Former Smoker    Packs/day: 0.10    Types: Cigarettes    Last attempt to quit: 05/25/2011    Years since quitting: 6.1  . Smokeless  tobacco: Never Used  Substance and Sexual Activity  . Alcohol use: Yes    Comment: rare  . Drug use: No  . Sexual activity: Not on file  Other Topics Concern  . Not on file  Social History Narrative   From [place]----     Current Outpatient Medications on File Prior to Visit  Medication Sig Dispense Refill  . betamethasone dipropionate (DIPROLENE) 0.05 % cream Apply topically 2 (two) times daily. 30 g 1  . ONE TOUCH LANCETS MISC by Does not apply route 4 (four) times daily.       No current facility-administered medications on file prior to visit.     No Known Allergies  Family History  Problem Relation Age of Onset  . Coronary artery disease Father        mid 55s  . Diabetes Father   . Hypertension Mother   . Stroke Mother   . Diabetes Mother   . Colon cancer Neg Hx   . Prostate cancer Neg Hx     BP 112/78 (BP Location: Left Arm, Patient Position: Sitting, Cuff Size: Normal)   Pulse (!) 57   Wt 228 lb 9.6 oz (103.7 kg)   SpO2 97%   BMI 29.35 kg/m    Review of  Systems Denies LOC    Objective:   Physical Exam VITAL SIGNS:  See vs page.   GENERAL: no distress.   Pulses: dorsalis pedis intact bilat.   MSK: no deformity of the feet.   CV: trace bilat leg edema.   Skin:  no ulcer on the feet.  normal color and temp on the feet, except for vitiligo.   Neuro: sensation is intact to touch on the feet, but decreased from normal.   Lab Results  Component Value Date   HGBA1C 11.1 07/27/2017       Assessment & Plan:  Type 1 DM, with polyneuropathy: worse Noncompliance with cbg recording: we'll simplify regimen by emphasizing the basal insulin  Patient Instructions  check your blood sugar twice a day.  vary the time of day when you check, between before the 3 meals, and at bedtime.  also check if you have symptoms of your blood sugar being too high or too low.  please keep a record of the readings and bring it to your next appointment here.  please call us sooner  if your blood sugar goes below 70, or if it stays over 150.    Please change the insulin to those listed below.   Please come back for a follow-up appointment in 2 months.

## 2017-09-26 ENCOUNTER — Encounter: Payer: Self-pay | Admitting: Endocrinology

## 2017-09-26 ENCOUNTER — Ambulatory Visit: Payer: Managed Care, Other (non HMO) | Admitting: Endocrinology

## 2017-09-26 VITALS — BP 120/64 | HR 68 | Wt 233.8 lb

## 2017-09-26 DIAGNOSIS — E1142 Type 2 diabetes mellitus with diabetic polyneuropathy: Secondary | ICD-10-CM

## 2017-09-26 DIAGNOSIS — G8929 Other chronic pain: Secondary | ICD-10-CM

## 2017-09-26 DIAGNOSIS — M25561 Pain in right knee: Secondary | ICD-10-CM | POA: Diagnosis not present

## 2017-09-26 LAB — SEDIMENTATION RATE: SED RATE: 20 mm/h (ref 0–20)

## 2017-09-26 LAB — URIC ACID: Uric Acid, Serum: 3.5 mg/dL — ABNORMAL LOW (ref 4.0–7.8)

## 2017-09-26 LAB — HEMOGLOBIN A1C: HEMOGLOBIN A1C: 9.6 % — AB (ref 4.6–6.5)

## 2017-09-26 LAB — POCT GLYCOSYLATED HEMOGLOBIN (HGB A1C): Hemoglobin A1C: 9

## 2017-09-26 NOTE — Patient Instructions (Addendum)
check your blood sugar twice a day.  vary the time of day when you check, between before the 3 meals, and at bedtime.  also check if you have symptoms of your blood sugar being too high or too low.  please keep a record of the readings and bring it to your next appointment here.  please call us sooner if your blood sugar goes below 70, or if it stays over 150.    Please go back to see Dr Jerl Santos.  you will receive a phone call, about a day and time for an appointment blood tests are requested for you today.  We'll let you know about the results.   Please come back for a follow-up appointment in 2 months.

## 2017-09-26 NOTE — Progress Notes (Signed)
Subjective:    Patient ID: Daniel [name], male    DOB: Mar 09, 1956, 62 y.o.   MRN: [id number]  HPI Pt returns for f/u of diabetes mellitus: DM type: 1 is dx'ed, due to vitiligo, lean body habitus, and failure of oral rx.   Dx'ed: 2010 Complications: polyneuropathy.  Therapy: insulin since 2017.   DKA: never.  Severe hypoglycemia: never.  Pancreatitis: never.   Other: he takes multiple daily injections. he works 3rd shift; he declines pump and continuous glucose monitor.   Interval history: no cbg record, but states cbg's vary from 70 to the mid-100's.  He seldom has hypoglycemia, and these episodes are mild. pt states he feels well in general.   Past Medical History:  Diagnosis Date  . Diabetes mellitus 05/17/07  . DJD (degenerative joint disease) of knee    Right knee, end stage, injected 09/2014, Dr. Jerl Santos  . Flexor tenosynovitis of finger    Left, Dr. Jerl Santos  . Hyperlipidemia   . Knee pain    w/u included a MRI of the knee (-) and back, dx w/ Spinal Stenosis (believed to be causing knee pain)  . Normal cardiac stress test 09/2007  . Shoulder impingement    Right, Dr. Jerl Santos  . Vitiligo    dx in the 90s    Past Surgical History:  Procedure Laterality Date  . FINGER SURGERY    . SHOULDER SURGERY  09-2010   Left, arthroscopy    Social History   Socioeconomic History  . Marital status: Married    Spouse name: Not on file  . Number of children: 2  . Years of education: Not on file  . Highest education level: Not on file  Occupational History  . 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 Financial resource strain: Not on file  . Food insecurity:    Worry: Not on file    Inability: Not on file  . Transportation needs:    Medical: Not on file    Non-medical: Not on file  Tobacco Use  . Smoking status: Former Smoker    Packs/day: 0.10    Types: Cigarettes    Last attempt to quit: 05/25/2011    Years since quitting: 6.3  . Smokeless tobacco: Never  Used  Substance and Sexual Activity  . Alcohol use: Yes    Comment: rare  . Drug use: No  . Sexual activity: Not on file  Lifestyle  . Physical activity:    Days per week: Not on file    Minutes per session: Not on file  . Stress: Not on file  Relationships  . Social connections:    Talks on phone: Not on file    Gets together: Not on file    Attends religious service: Not on file    Active member of club or organization: Not on file    Attends meetings of clubs or organizations: Not on file    Relationship status: Not on file  . Intimate partner violence:    Fear of current or ex partner: Not on file    Emotionally abused: Not on file    Physically abused: Not on file    Forced sexual activity: Not on file  Other Topics Concern  . Not on file  Social History Narrative   From [place]----     Current Outpatient Medications on File Prior to Visit  Medication Sig Dispense Refill  . betamethasone dipropionate (DIPROLENE) 0.05 % cream Apply topically 2 (two) times  daily. 30 g 1  . insulin degludec (TRESIBA FLEXTOUCH) 100 UNIT/ML SOPN FlexTouch Pen Inject 0.6 mLs (60 Units total) into the skin daily. 10 pen 11  . insulin lispro (HUMALOG) 100 UNIT/ML injection Inject 0.05 mLs (5 Units total) into the skin 3 (three) times daily with meals. And pen needles 4/day. 30 mL 11  . ONE TOUCH LANCETS MISC by Does not apply route 4 (four) times daily.       No current facility-administered medications on file prior to visit.     No Known Allergies  Family History  Problem Relation Age of Onset  . Coronary artery disease Father        mid 36s  . Diabetes Father   . Hypertension Mother   . Stroke Mother   . Diabetes Mother   . Colon cancer Neg Hx   . Prostate cancer Neg Hx     BP 120/64   Pulse 68   Wt 233 lb 12.8 oz (106.1 kg)   SpO2 95%   BMI 30.02 kg/m    Review of Systems He has right knee pain (he saw Dr Jerl Santos in 2016)    Objective:   Physical Exam VITAL SIGNS:   See vs page GENERAL: no distress Pulses: foot pulses are intact bilaterally.   MSK: no deformity of the feet or ankles.  CV: no edema of the legs or ankles Skin:  no ulcer on the feet or ankles.  normal color and temp on the feet and ankles.  bilat vitiligo.   Neuro: sensation is intact to touch on the feet and ankles.   Ext: There is bilateral onychomycosis of the toenails   Lab Results  Component Value Date   HGBA1C 9.0 09/26/2017       Assessment & Plan:  Type 1 DM: a1c is much higher than would be suggested by cbg's Knee pain, new to me.  Patient Instructions  check your blood sugar twice a day.  vary the time of day when you check, between before the 3 meals, and at bedtime.  also check if you have symptoms of your blood sugar being too high or too low.  please keep a record of the readings and bring it to your next appointment here.  please call us sooner if your blood sugar goes below 70, or if it stays over 150.    Please go back to see Dr Jerl Santos.  you will receive a phone call, about a day and time for an appointment blood tests are requested for you today.  We'll let you know about the results.   Please come back for a follow-up appointment in 2 months.

## 2017-09-27 LAB — FRUCTOSAMINE: FRUCTOSAMINE: 357 umol/L — AB (ref 190–270)

## 2017-09-28 ENCOUNTER — Telehealth: Payer: Self-pay | Admitting: *Deleted

## 2017-09-28 NOTE — Telephone Encounter (Signed)
Please forward this to [facility]

## 2017-09-28 NOTE — Telephone Encounter (Signed)
[facility] called stating the referral for chronic right knee pain that was placed for pt needs to be sent to [facility].   Dr. Jerl Santos is at [facility]. Please replace referral.

## 2017-10-03 ENCOUNTER — Telehealth: Payer: Self-pay | Admitting: Endocrinology

## 2017-10-03 NOTE — Telephone Encounter (Signed)
Please refer this to [facility] 

## 2017-10-03 NOTE — Telephone Encounter (Signed)
Should this referral have gone to Dr. Jerl Santos or [facility]? Patient's wife is calling in status & I see notes, but I am unsure on what to advise her?

## 2017-10-03 NOTE — Telephone Encounter (Signed)
This is just an FYI to [employer]:  I called and spoke with patient's wife & stated that this referral would be sent back to [facility]. I looked at the notes on the referral & called [facility] to get them to send me a referral form, since that is the location that Dr. Jerl Santos is located. I have printed off referral & last OV notes. When I get their form I will fill out & send them referral. When this is completed patient's wife asked for a call back.

## 2017-10-03 NOTE — Telephone Encounter (Signed)
Patients Wife stated that the patient was suppose to have a referral sent out concerning his knee and they have not received a call regarding this.  I see one referral in for the patient but it is in closed status.    Please advise

## 2017-10-03 NOTE — Telephone Encounter (Signed)
Thank you for going the extra mile Maralyn Sago!

## 2017-10-04 NOTE — Telephone Encounter (Signed)
I have faxed referral with insurance card to [facility].

## 2017-10-04 NOTE — Telephone Encounter (Signed)
I never received referral fax form from [facility] so I have called back & they are supposed to be faxing again to front office fax.

## 2017-10-19 ENCOUNTER — Telehealth: Payer: Self-pay | Admitting: Endocrinology

## 2017-10-19 NOTE — Telephone Encounter (Signed)
error 

## 2017-10-19 NOTE — Telephone Encounter (Signed)
I have spoke with patient & have put two samples in frig for patient to pick up. His insurance requires him now to [facility]. He has filled from retail pharmacy too many times & is now waiting on mail order.

## 2017-10-19 NOTE — Telephone Encounter (Addendum)
Patient wife stated that Dr Everardo All need to [facility] for [facility] PA in order for this patient to get his medication.  insulin degludec (TRESIBA FLEXTOUCH) 100 UNIT/ML SOPN FlexTouch Pen [[id number]]  Send in a prescription [facility] Fax # [phone] Phone# [phone]    Patient asking Dr Everardo All to call this number [phone] so he can get a weeks worth until he get his insulin back [facility]  Send to  Pharmacy:  [id number] - [place], [place] - [address] AT [facility] [address] #:  [id number]

## 2017-10-20 MED ORDER — INSULIN DEGLUDEC 100 UNIT/ML ~~LOC~~ SOPN: 60.0000 [IU] | PEN_INJECTOR | Freq: Every day | SUBCUTANEOUS | 1 refills | Status: DC

## 2017-10-20 NOTE — Telephone Encounter (Signed)
Pt requesting Evaristo Bury rx be sent to [facility]. Rx sent. See meds.

## 2017-10-20 NOTE — Addendum Note (Signed)
Addended by: Merrilyn Puma on: 10/20/2017 09:59 AM   Modules accepted: Orders

## 2017-12-05 ENCOUNTER — Encounter: Payer: Self-pay | Admitting: Endocrinology

## 2017-12-05 ENCOUNTER — Ambulatory Visit: Payer: Managed Care, Other (non HMO) | Admitting: Endocrinology

## 2017-12-05 VITALS — BP 116/72 | HR 60 | Temp 98.0°F | Ht 74.0 in | Wt 236.8 lb

## 2017-12-05 DIAGNOSIS — E1142 Type 2 diabetes mellitus with diabetic polyneuropathy: Secondary | ICD-10-CM | POA: Diagnosis not present

## 2017-12-05 LAB — POCT GLYCOSYLATED HEMOGLOBIN (HGB A1C): Hemoglobin A1C: 9 % — AB (ref 4.0–5.6)

## 2017-12-05 MED ORDER — BD PEN NEEDLE NANO U/F 32G X 4 MM MISC: 99 refills | Status: DC

## 2017-12-05 NOTE — Progress Notes (Signed)
Subjective:    Patient ID: Daniel [name], male    DOB: Jul 18, 1955, 62 y.o.   MRN: [id number]  HPI Pt returns for f/u of diabetes mellitus: DM type: 1 is dx'ed, due to vitiligo, lean body habitus, and failure of oral rx.   Dx'ed: 2010 Complications: polyneuropathy.  Therapy: insulin since 2017.   DKA: never.  Severe hypoglycemia: never.  Pancreatitis: never.   Other: he takes multiple daily injections. he works 3rd shift; he declines pump and continuous glucose monitor; fructosamine and cbg's have shown better glycemic control than A1c.    Interval history: no cbg record, but states cbg's vary from 70-150.  He seldom has hypoglycemia, and these episodes are mild. pt states he feels well in general.   Past Medical History:  Diagnosis Date  . Diabetes mellitus 05/17/07  . DJD (degenerative joint disease) of knee    Right knee, end stage, injected 09/2014, Dr. Jerl Santos  . Flexor tenosynovitis of finger    Left, Dr. Jerl Santos  . Hyperlipidemia   . Knee pain    w/u included a MRI of the knee (-) and back, dx w/ Spinal Stenosis (believed to be causing knee pain)  . Normal cardiac stress test 09/2007  . Shoulder impingement    Right, Dr. Jerl Santos  . Vitiligo    dx in the 90s    Past Surgical History:  Procedure Laterality Date  . FINGER SURGERY    . SHOULDER SURGERY  09-2010   Left, arthroscopy    Social History   Socioeconomic History  . Marital status: Married    Spouse name: Not on file  . Number of children: 2  . Years of education: Not on file  . Highest education level: Not on file  Occupational History  . 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 Financial resource strain: Not on file  . Food insecurity:    Worry: Not on file    Inability: Not on file  . Transportation needs:    Medical: Not on file    Non-medical: Not on file  Tobacco Use  . Smoking status: Former Smoker    Packs/day: 0.10    Types: Cigarettes    Last attempt to quit: 05/25/2011   Years since quitting: 6.5  . Smokeless tobacco: Never Used  Substance and Sexual Activity  . Alcohol use: Yes    Comment: rare  . Drug use: No  . Sexual activity: Not on file  Lifestyle  . Physical activity:    Days per week: Not on file    Minutes per session: Not on file  . Stress: Not on file  Relationships  . Social connections:    Talks on phone: Not on file    Gets together: Not on file    Attends religious service: Not on file    Active member of club or organization: Not on file    Attends meetings of clubs or organizations: Not on file    Relationship status: Not on file  . Intimate partner violence:    Fear of current or ex partner: Not on file    Emotionally abused: Not on file    Physically abused: Not on file    Forced sexual activity: Not on file  Other Topics Concern  . Not on file  Social History Narrative   From [place]----     Current Outpatient Medications on File Prior to Visit  Medication Sig Dispense Refill  . betamethasone dipropionate (DIPROLENE) 0.05 %  cream Apply topically 2 (two) times daily. 30 g 1  . insulin degludec (TRESIBA FLEXTOUCH) 100 UNIT/ML SOPN FlexTouch Pen Inject 0.6 mLs (60 Units total) into the skin daily. 20 pen 1  . ONE TOUCH LANCETS MISC by Does not apply route 4 (four) times daily.       No current facility-administered medications on file prior to visit.     No Known Allergies  Family History  Problem Relation Age of Onset  . Coronary artery disease Father        mid 7380s  . Diabetes Father   . Hypertension Mother   . Stroke Mother   . Diabetes Mother   . Colon cancer Neg Hx   . Prostate cancer Neg Hx     BP 116/72 (BP Location: Left Arm, Patient Position: Sitting, Cuff Size: Normal)   Pulse 60   Temp 98 F (36.7 C) (Oral)   Ht 6\' 2"  (1.88 m)   Wt 236 lb 12.8 oz (107.4 kg)   SpO2 96%   BMI 30.40 kg/m    Review of Systems Denies LOC.  He has weight gain.      Objective:   Physical Exam VITAL SIGNS:  See  vs page GENERAL: no distress Pulses: dorsalis pedis intact bilat.   MSK: no deformity of the feet CV: 1+ bilat leg edema Skin:  no ulcer on the feet.  normal color and temp on the feet. Neuro: sensation is intact to touch on the feet   Lab Results  Component Value Date   HGBA1C 9.0 (A) 12/05/2017       Assessment & Plan:  Type 1 DM, with polyneuropathy. well-controlled. Abnormal hemoglobin: fructosamine and cbg's are more accurate than a1c.   Patient Instructions  check your blood sugar twice a day.  vary the time of day when you check, between before the 3 meals, and at bedtime.  also check if you have symptoms of your blood sugar being too high or too low.  please keep a record of the readings and bring it to your next appointment here.  please call us sooner if your blood sugar goes below 70, or if it stays over 150.    Please continue the same insulin.   Please come back for a follow-up appointment in 3-4 months.

## 2017-12-05 NOTE — Patient Instructions (Addendum)
check your blood sugar twice a day.  vary the time of day when you check, between before the 3 meals, and at bedtime.  also check if you have symptoms of your blood sugar being too high or too low.  please keep a record of the readings and bring it to your next appointment here.  please call us sooner if your blood sugar goes below 70, or if it stays over 150.    Please continue the same insulin.   Please come back for a follow-up appointment in 3-4 months.

## 2018-01-11 ENCOUNTER — Telehealth: Payer: Self-pay | Admitting: Emergency Medicine

## 2018-01-11 NOTE — Telephone Encounter (Signed)
I do not see evidence of this in patient's chart. Please advise?

## 2018-01-11 NOTE — Telephone Encounter (Signed)
please call patient: I do not understand.  Do you want to take the humalog in addition to the tresiba?  Please let us know

## 2018-01-11 NOTE — Telephone Encounter (Signed)
I spoke to patient's wife & humalog was the box her husband gave her asking to reorder. I stated that humalog was d/c & he should only be taking the tresiba.

## 2018-01-11 NOTE — Telephone Encounter (Signed)
LVM on patient's wife cell to call back to clarify. We do not have humalog on med list & he isn't aware of patient taking it.

## 2018-01-11 NOTE — Telephone Encounter (Signed)
Pt needs a refill on his Humalog. She also stated Dr Runell GessEillson said eh was going to change the dosage but not sure what to. I didn't see Humalog on patients med list. Can you check into this and give the patient a call back thanks.

## 2018-01-16 ENCOUNTER — Telehealth: Payer: Self-pay | Admitting: Emergency Medicine

## 2018-01-16 NOTE — Telephone Encounter (Signed)
Error

## 2018-01-17 ENCOUNTER — Ambulatory Visit (INDEPENDENT_AMBULATORY_CARE_PROVIDER_SITE_OTHER): Payer: Managed Care, Other (non HMO) | Admitting: Endocrinology

## 2018-01-17 ENCOUNTER — Encounter: Payer: Self-pay | Admitting: Endocrinology

## 2018-01-17 VITALS — BP 116/110 | HR 59 | Ht 74.0 in | Wt 230.8 lb

## 2018-01-17 DIAGNOSIS — E1142 Type 2 diabetes mellitus with diabetic polyneuropathy: Secondary | ICD-10-CM | POA: Diagnosis not present

## 2018-01-17 MED ORDER — INSULIN LISPRO 100 UNIT/ML (KWIKPEN): 4.0000 [IU] | PEN_INJECTOR | Freq: Three times a day (TID) | SUBCUTANEOUS | 11 refills | Status: DC

## 2018-01-17 NOTE — Progress Notes (Signed)
Subjective:    Patient ID: Daniel [name], male    DOB: 03-26-1956, 62 y.o.   MRN: [id number]  HPI Pt returns for f/u of diabetes mellitus: DM type: 1 is dx'ed, due to vitiligo, lean body habitus, and failure of oral rx.   Dx'ed: 2010 Complications: polyneuropathy.  Therapy: insulin since 2017.   DKA: never.  Severe hypoglycemia: never.  Pancreatitis: never.   Other: he takes multiple daily injections. he works 3rd shift; he declines pump and continuous glucose monitor; fructosamine and cbg's have shown better glycemic control than A1c.    Interval history: He got a steroid injection into the right shoulder last week.  Aside from the few days after the injection, he says cbg's vary from 103-300. It is in general highest after eating.  Past Medical History:  Diagnosis Date  . Diabetes mellitus 05/17/07  . DJD (degenerative joint disease) of knee    Right knee, end stage, injected 09/2014, Dr. Jerl Santos  . Flexor tenosynovitis of finger    Left, Dr. Jerl Santos  . Hyperlipidemia   . Knee pain    w/u included a MRI of the knee (-) and back, dx w/ Spinal Stenosis (believed to be causing knee pain)  . Normal cardiac stress test 09/2007  . Shoulder impingement    Right, Dr. Jerl Santos  . Vitiligo    dx in the 90s    Past Surgical History:  Procedure Laterality Date  . FINGER SURGERY    . SHOULDER SURGERY  09-2010   Left, arthroscopy    Social History   Socioeconomic History  . Marital status: Married    Spouse name: Not on file  . Number of children: 2  . Years of education: Not on file  . Highest education level: Not on file  Occupational History  . 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 Financial resource strain: Not on file  . Food insecurity:    Worry: Not on file    Inability: Not on file  . Transportation needs:    Medical: Not on file    Non-medical: Not on file  Tobacco Use  . Smoking status: Former Smoker    Packs/day: 0.10    Types: Cigarettes   Last attempt to quit: 05/25/2011    Years since quitting: 6.6  . Smokeless tobacco: Never Used  Substance and Sexual Activity  . Alcohol use: Yes    Comment: rare  . Drug use: No  . Sexual activity: Not on file  Lifestyle  . Physical activity:    Days per week: Not on file    Minutes per session: Not on file  . Stress: Not on file  Relationships  . Social connections:    Talks on phone: Not on file    Gets together: Not on file    Attends religious service: Not on file    Active member of club or organization: Not on file    Attends meetings of clubs or organizations: Not on file    Relationship status: Not on file  . Intimate partner violence:    Fear of current or ex partner: Not on file    Emotionally abused: Not on file    Physically abused: Not on file    Forced sexual activity: Not on file  Other Topics Concern  . Not on file  Social History Narrative   From [place]----     Current Outpatient Medications on File Prior to Visit  Medication Sig Dispense Refill  .  BD PEN NEEDLE NANO U/F 32G X 4 MM MISC USE TO INJECT INSULIN FOUR TIMES DAILY 400 each PRN  . insulin degludec (TRESIBA FLEXTOUCH) 100 UNIT/ML SOPN FlexTouch Pen Inject 0.6 mLs (60 Units total) into the skin daily. 20 pen 1  . ONE TOUCH LANCETS MISC by Does not apply route 4 (four) times daily.       No current facility-administered medications on file prior to visit.     No Known Allergies  Family History  Problem Relation Age of Onset  . Coronary artery disease Father        mid 3580s  . Diabetes Father   . Hypertension Mother   . Stroke Mother   . Diabetes Mother   . Colon cancer Neg Hx   . Prostate cancer Neg Hx     BP (!) 116/110 (BP Location: Left Arm, Patient Position: Sitting, Cuff Size: Normal)   Pulse (!) 59   Ht 6\' 2"  (1.88 m)   Wt 230 lb 12.8 oz (104.7 kg)   SpO2 97%   BMI 29.63 kg/m    Review of Systems He denies hypoglycemia.     Objective:   Physical Exam VITAL SIGNS:  See vs  page GENERAL: no distress Pulses: foot pulses are intact bilaterally.   MSK: no deformity of the feet or ankles.  CV: no edema of the legs or ankles.   Skin:  no ulcer on the feet or ankles.  normal color and temp on the feet and ankles, except for vitiligo.   Neuro: sensation is intact to touch on the feet and ankles.    Lab Results  Component Value Date   HGBA1C 9.0 (A) 12/05/2017       Assessment & Plan:  HTN: is noted today.   Type 1 DM, with polyneuropathy: he needs increased rx.     Patient Instructions  Your blood pressure is high today.  Please see your primary care provider soon, to have it rechecked check your blood sugar twice a day.  vary the time of day when you check, between before the 3 meals, and at bedtime.  also check if you have symptoms of your blood sugar being too high or too low.  please keep a record of the readings and bring it to your next appointment here.  please call us sooner if your blood sugar goes below 70, or if it stays over 150.    Please continue the same tresiba, and. I have sent a prescription to your pharmacy, to add "humalog," 4 units 3 times a day (just before each meal) Please come back for a follow-up appointment in 2 months.

## 2018-01-17 NOTE — Patient Instructions (Addendum)
Your blood pressure is high today.  Please see your primary care provider soon, to have it rechecked check your blood sugar twice a day.  vary the time of day when you check, between before the 3 meals, and at bedtime.  also check if you have symptoms of your blood sugar being too high or too low.  please keep a record of the readings and bring it to your next appointment here.  please call us sooner if your blood sugar goes below 70, or if it stays over 150.    Please continue the same tresiba, and. I have sent a prescription to your pharmacy, to add "humalog," 4 units 3 times a day (just before each meal) Please come back for a follow-up appointment in 2 months.

## 2018-01-18 ENCOUNTER — Telehealth: Payer: Self-pay | Admitting: Endocrinology

## 2018-01-18 NOTE — Telephone Encounter (Signed)
I have called [facility] & canceled prescription for Humalog.

## 2018-01-18 NOTE — Telephone Encounter (Signed)
insulin lispro (HUMALOG KWIKPEN) 100 UNIT/ML KiwkPen   Patients wife would like a call back about patients insulin. [facility] since they are getting texts from them saying there is a problems processing this due to insurance  Please advise

## 2018-04-11 ENCOUNTER — Ambulatory Visit: Payer: Managed Care, Other (non HMO) | Admitting: Endocrinology

## 2018-04-11 ENCOUNTER — Encounter: Payer: Self-pay | Admitting: Endocrinology

## 2018-04-11 VITALS — BP 118/72 | HR 62 | Ht 74.0 in | Wt 231.2 lb

## 2018-04-11 DIAGNOSIS — E1142 Type 2 diabetes mellitus with diabetic polyneuropathy: Secondary | ICD-10-CM | POA: Diagnosis not present

## 2018-04-11 DIAGNOSIS — Z23 Encounter for immunization: Secondary | ICD-10-CM | POA: Diagnosis not present

## 2018-04-11 LAB — POCT GLYCOSYLATED HEMOGLOBIN (HGB A1C): HEMOGLOBIN A1C: 9.9 % — AB (ref 4.0–5.6)

## 2018-04-11 MED ORDER — INSULIN LISPRO (1 UNIT DIAL) 100 UNIT/ML (KWIKPEN): 6.0000 [IU] | PEN_INJECTOR | Freq: Three times a day (TID) | SUBCUTANEOUS | 11 refills | Status: DC

## 2018-04-11 NOTE — Progress Notes (Signed)
Subjective:    Patient ID: Daniel [name], male    DOB: 09-30-1955, 62 y.o.   MRN: [id number]  HPI Pt returns for f/u of diabetes mellitus: DM type: 1 is dx'ed, due to vitiligo, lean body habitus, and failure of oral rx.   Dx'ed: 2010 Complications: polyneuropathy.  Therapy: insulin since 2017.   DKA: never.  Severe hypoglycemia: never.  Pancreatitis: never.   Other: he takes multiple daily injections. he works 3rd shift; he declines pump and continuous glucose monitor; fructosamine and cbg's have shown approx 1.5% pts better glycemic control than A1c itself.    Interval history: no recent steroids.  no cbg record, but states cbg's vary from 130-170 Past Medical History:  Diagnosis Date  . Diabetes mellitus 05/17/07  . DJD (degenerative joint disease) of knee    Right knee, end stage, injected 09/2014, Dr. Jerl Santosalldorf  . Flexor tenosynovitis of finger    Left, Dr. Jerl Santosalldorf  . Hyperlipidemia   . Knee pain    w/u included a MRI of the knee (-) and back, dx w/ Spinal Stenosis (believed to be causing knee pain)  . Normal cardiac stress test 09/2007  . Shoulder impingement    Right, Dr. Jerl Santosalldorf  . Vitiligo    dx in the 90s    Past Surgical History:  Procedure Laterality Date  . FINGER SURGERY    . SHOULDER SURGERY  09-2010   Left, arthroscopy    Social History   Socioeconomic History  . Marital status: Married    Spouse name: Not on file  . Number of children: 2  . Years of education: Not on file  . Highest education level: Not on file  Occupational History  . 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 Financial resource strain: Not on file  . Food insecurity:    Worry: Not on file    Inability: Not on file  . Transportation needs:    Medical: Not on file    Non-medical: Not on file  Tobacco Use  . Smoking status: Former Smoker    Packs/day: 0.10    Types: Cigarettes    Last attempt to quit: 05/25/2011    Years since quitting: 6.8  . Smokeless tobacco:  Never Used  Substance and Sexual Activity  . Alcohol use: Yes    Comment: rare  . Drug use: No  . Sexual activity: Not on file  Lifestyle  . Physical activity:    Days per week: Not on file    Minutes per session: Not on file  . Stress: Not on file  Relationships  . Social connections:    Talks on phone: Not on file    Gets together: Not on file    Attends religious service: Not on file    Active member of club or organization: Not on file    Attends meetings of clubs or organizations: Not on file    Relationship status: Not on file  . Intimate partner violence:    Fear of current or ex partner: Not on file    Emotionally abused: Not on file    Physically abused: Not on file    Forced sexual activity: Not on file  Other Topics Concern  . Not on file  Social History Narrative   From [place]----     Current Outpatient Medications on File Prior to Visit  Medication Sig Dispense Refill  . BD PEN NEEDLE NANO U/F 32G X 4 MM MISC USE TO INJECT INSULIN  FOUR TIMES DAILY 400 each PRN  . insulin degludec (TRESIBA FLEXTOUCH) 100 UNIT/ML SOPN FlexTouch Pen Inject 0.6 mLs (60 Units total) into the skin daily. 20 pen 1  . ONE TOUCH LANCETS MISC by Does not apply route 4 (four) times daily.       No current facility-administered medications on file prior to visit.     No Known Allergies  Family History  Problem Relation Age of Onset  . Coronary artery disease Father        mid 67s  . Diabetes Father   . Hypertension Mother   . Stroke Mother   . Diabetes Mother   . Colon cancer Neg Hx   . Prostate cancer Neg Hx     BP 118/72 (BP Location: Left Arm, Patient Position: Sitting, Cuff Size: Normal)   Pulse 62   Ht 6\' 2"  (1.88 m)   Wt 231 lb 3.2 oz (104.9 kg)   SpO2 93%   BMI 29.68 kg/m    Review of Systems He denies hypoglycemia    Objective:   Physical Exam VITAL SIGNS:  See vs page GENERAL: no distress Pulses: dorsalis pedis intact bilat.   MSK: no deformity of the  feet CV: no leg edema Skin:  no ulcer on the feet.  normal temp on the feet.  Vitiligo is noted.  Neuro: sensation is intact to touch on the feet.      Lab Results  Component Value Date   HGBA1C 9.9 (A) 04/11/2018       Assessment & Plan:  Type 1 DM: worse Occupational status (3rd shift work): in this setting, tresiba is preferred basal insulin.  Patient Instructions  check your blood sugar twice a day.  vary the time of day when you check, between before the 3 meals, and at bedtime.  also check if you have symptoms of your blood sugar being too high or too low.  please keep a record of the readings and bring it to your next appointment here.  please call us sooner if your blood sugar goes below 70, or if it stays over 150.    Please continue the same tresiba, and: I have sent a prescription to your pharmacy, to increase humalog to 6 units 3 times a day (just before each meal) Please come back for a follow-up appointment in 2 months.

## 2018-04-11 NOTE — Patient Instructions (Addendum)
check your blood sugar twice a day.  vary the time of day when you check, between before the 3 meals, and at bedtime.  also check if you have symptoms of your blood sugar being too high or too low.  please keep a record of the readings and bring it to your next appointment here.  please call us sooner if your blood sugar goes below 70, or if it stays over 150.    Please continue the same tresiba, and: I have sent a prescription to your pharmacy, to increase humalog to 6 units 3 times a day (just before each meal) Please come back for a follow-up appointment in 2 months.

## 2018-05-09 ENCOUNTER — Ambulatory Visit: Payer: Self-pay | Admitting: Internal Medicine

## 2018-05-09 ENCOUNTER — Ambulatory Visit: Payer: Managed Care, Other (non HMO) | Admitting: Family Medicine

## 2018-05-09 ENCOUNTER — Encounter: Payer: Self-pay | Admitting: Family Medicine

## 2018-05-09 VITALS — BP 118/74 | HR 66 | Temp 97.8°F | Ht 74.0 in | Wt 226.2 lb

## 2018-05-09 DIAGNOSIS — K529 Noninfective gastroenteritis and colitis, unspecified: Secondary | ICD-10-CM

## 2018-05-09 MED ORDER — ONDANSETRON HCL 4 MG PO TABS: 4.0000 mg | ORAL_TABLET | Freq: Three times a day (TID) | ORAL | 0 refills | Status: DC | PRN

## 2018-05-09 MED ORDER — DICYCLOMINE HCL 10 MG PO CAPS: 10.0000 mg | ORAL_CAPSULE | Freq: Three times a day (TID) | ORAL | 0 refills | Status: DC

## 2018-05-09 NOTE — Progress Notes (Signed)
Pre visit review using our clinic review tool, if applicable. No additional management support is needed unless otherwise documented below in the visit note. 

## 2018-05-09 NOTE — Patient Instructions (Addendum)
You may have some diarrhea before this is over.   I think you will continue to get better.  Stay hydrated with water. Drink low calorie Gatorade/Powerade or Pedialyte as long as you are having vomiting and/or diarrhea.  You can travel if you feel well enough to.  Wash your hands frequently with soap and water.   Let us know if you need anything.

## 2018-05-09 NOTE — Progress Notes (Signed)
Chief Complaint  Patient presents with  . Nausea  . Abdominal Pain     Subjective Daniel [name] is a 62 y.o. male who presents with vomiting. Here w his wife.  Symptoms began 1 d ago.  Patient has abdominal pain, cramping, vomiting and nausea Patient denies diarrhea and fever Evaluation to date: Pepto Bismol Sick contacts: none, ate at [employer] yesterday  Past Medical History:  Diagnosis Date  . Diabetes mellitus 05/17/07  . DJD (degenerative joint disease) of knee    Right knee, end stage, injected 09/2014, Dr. Jerl Santosalldorf  . Flexor tenosynovitis of finger    Left, Dr. Jerl Santosalldorf  . Hyperlipidemia   . Knee pain    w/u included a MRI of the knee (-) and back, dx w/ Spinal Stenosis (believed to be causing knee pain)  . Normal cardiac stress test 09/2007  . Shoulder impingement    Right, Dr. Jerl Santosalldorf  . Vitiligo    dx in the 90s   No Known Allergies  Review of Systems Constitutional:  No fevers or chills Ear/Nose/Mouth/Throat:  No red eyes Gastrointestinal:  As noted in the HPI Musculoskeletal/Extremities: no myalgias Integumentary (Skin/Breast): no rash  Exam BP 118/74 (BP Location: Left Arm, Patient Position: Sitting, Cuff Size: Large)   Pulse 66   Temp 97.8 F (36.6 C) (Oral)   Ht 6\' 2"  (1.88 m)   Wt 226 lb 4 oz (102.6 kg)   SpO2 96%   BMI 29.05 kg/m  General:  well developed, well hydrated, in no apparent distress Skin:  warm, no pallor or diaphoresis, no rashes Throat/Pharynx:  lips and gingiva without lesion; tongue and uvula midline; non-inflamed pharynx; no exudates or postnasal drainage Neck: neck supple without adenopathy, thyromegaly, or masses Lungs:  clear to auscultation, breath sounds equal bilaterally, no respiratory distress, no wheezes Cardio:  regular rate and rhythm without murmurs Abdomen:  abdomen soft, mild ttp in lower quadrants; bowel sounds normal; no masses or organomegaly Extremities:  no clubbing, cyanosis, or edema Psych: Age  appropriate behavior and responsive to exam  Assessment and Plan  Gastroenteritis - Plan: ondansetron (ZOFRAN) 4 MG tablet, dicyclomine (BENTYL) 10 MG capsule  Orders as above. I think there is a reasonable chance he will have diarrhea prior to getting fully better. Tx s/s's, OK to travel tomorrow if he feels better. Wash hands w soap and water, practice good hand hygiene in general. Don't share drinks/towels.  Avoid aggravating foods, discussed BRAT diet. Push fluids with electrolytes.  F/u if symptoms fail to improve, sooner if worsening. The patient and his wife voiced understanding and agreement to the plan.  Jilda Rocheicholas Paul [place], DO 05/09/18  10:46 AM

## 2018-05-23 ENCOUNTER — Other Ambulatory Visit: Payer: Self-pay | Admitting: Endocrinology

## 2018-05-25 ENCOUNTER — Telehealth: Payer: Self-pay | Admitting: Endocrinology

## 2018-05-25 ENCOUNTER — Other Ambulatory Visit: Payer: Self-pay

## 2018-05-25 MED ORDER — INSULIN DEGLUDEC 100 UNIT/ML ~~LOC~~ SOPN: PEN_INJECTOR | SUBCUTANEOUS | 0 refills | Status: DC

## 2018-05-25 NOTE — Telephone Encounter (Signed)
Rx has been sent  

## 2018-05-25 NOTE — Telephone Encounter (Signed)
Patients wife called and requested a gap Rx for the [place] to be called into the [facility] on [address] in [place].  [facility] but patient has none currently.  Please call at [phone]

## 2018-06-14 ENCOUNTER — Ambulatory Visit: Payer: Self-pay | Admitting: Endocrinology

## 2018-06-23 ENCOUNTER — Encounter: Payer: Self-pay | Admitting: Endocrinology

## 2018-06-23 ENCOUNTER — Ambulatory Visit: Payer: Managed Care, Other (non HMO) | Admitting: Endocrinology

## 2018-06-23 VITALS — BP 122/68 | HR 68 | Ht 74.0 in | Wt 225.2 lb

## 2018-06-23 DIAGNOSIS — E1142 Type 2 diabetes mellitus with diabetic polyneuropathy: Secondary | ICD-10-CM | POA: Diagnosis not present

## 2018-06-23 LAB — POCT GLYCOSYLATED HEMOGLOBIN (HGB A1C): HEMOGLOBIN A1C: 10.5 % — AB (ref 4.0–5.6)

## 2018-06-23 MED ORDER — TERBINAFINE HCL 250 MG PO TABS: 250.0000 mg | ORAL_TABLET | Freq: Every day | ORAL | 0 refills | Status: DC

## 2018-06-23 MED ORDER — FREESTYLE LIBRE 14 DAY READER DEVI: 1.0000 | Freq: Once | 0 refills | Status: AC

## 2018-06-23 MED ORDER — FREESTYLE LIBRE 14 DAY SENSOR MISC: 1.0000 | 3 refills | Status: AC

## 2018-06-23 MED ORDER — INSULIN DEGLUDEC 100 UNIT/ML ~~LOC~~ SOPN: 70.0000 [IU] | PEN_INJECTOR | Freq: Every day | SUBCUTANEOUS | 11 refills | Status: DC

## 2018-06-23 NOTE — Progress Notes (Signed)
Subjective:    Patient ID: Daniel [name], male    DOB: 03/17/1956, 63 y.o.   MRN: [id number]  HPI Pt returns for f/u of diabetes mellitus: DM type: 1 is dx'ed, due to vitiligo, lean body habitus, and failure of oral rx.   Dx'ed: 2010 Complications: polyneuropathy.  Therapy: insulin since 2017.   DKA: never.  Severe hypoglycemia: never.  Pancreatitis: never.   Other: he takes multiple daily injections, but basal insulin is emphasized. he works 3rd shift; he declines pump; fructosamine and cbg's have shown approx 1.5% pts better glycemic control than A1c itself.    Interval history: no recent steroids.  He has not recently checked cbg.  He has missed 2 doses of tresiba since last ov.  Past Medical History:  Diagnosis Date  . Diabetes mellitus 05/17/07  . DJD (degenerative joint disease) of knee    Right knee, end stage, injected 09/2014, Dr. Jerl Santosalldorf  . Flexor tenosynovitis of finger    Left, Dr. Jerl Santosalldorf  . Hyperlipidemia   . Knee pain    w/u included a MRI of the knee (-) and back, dx w/ Spinal Stenosis (believed to be causing knee pain)  . Normal cardiac stress test 09/2007  . Shoulder impingement    Right, Dr. Jerl Santosalldorf  . Vitiligo    dx in the 90s    Past Surgical History:  Procedure Laterality Date  . FINGER SURGERY    . SHOULDER SURGERY  09-2010   Left, arthroscopy    Social History   Socioeconomic History  . Marital status: Married    Spouse name: Not on file  . Number of children: 2  . Years of education: Not on file  . Highest education level: Not on file  Occupational History  . 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 Financial resource strain: Not on file  . Food insecurity:    Worry: Not on file    Inability: Not on file  . Transportation needs:    Medical: Not on file    Non-medical: Not on file  Tobacco Use  . Smoking status: Former Smoker    Packs/day: 0.10    Types: Cigarettes    Last attempt to quit: 05/25/2011    Years since  quitting: 7.0  . Smokeless tobacco: Never Used  Substance and Sexual Activity  . Alcohol use: Yes    Comment: rare  . Drug use: No  . Sexual activity: Not on file  Lifestyle  . Physical activity:    Days per week: Not on file    Minutes per session: Not on file  . Stress: Not on file  Relationships  . Social connections:    Talks on phone: Not on file    Gets together: Not on file    Attends religious service: Not on file    Active member of club or organization: Not on file    Attends meetings of clubs or organizations: Not on file    Relationship status: Not on file  . Intimate partner violence:    Fear of current or ex partner: Not on file    Emotionally abused: Not on file    Physically abused: Not on file    Forced sexual activity: Not on file  Other Topics Concern  . Not on file  Social History Narrative   From [place]----     Current Outpatient Medications on File Prior to Visit  Medication Sig Dispense Refill  . insulin lispro (HUMALOG KWIKPEN)  100 UNIT/ML KwikPen Inject 0.06 mLs (6 Units total) into the skin 3 (three) times daily with meals. And pen needles 4/day 15 mL 11  . ondansetron (ZOFRAN) 4 MG tablet Take 1 tablet (4 mg total) by mouth every 8 (eight) hours as needed. 20 tablet 0  . ONE TOUCH LANCETS MISC by Does not apply route 4 (four) times daily.       No current facility-administered medications on file prior to visit.     No Known Allergies  Family History  Problem Relation Age of Onset  . Coronary artery disease Father        mid 81s  . Diabetes Father   . Hypertension Mother   . Stroke Mother   . Diabetes Mother   . Colon cancer Neg Hx   . Prostate cancer Neg Hx     BP 122/68 (BP Location: Right Arm, Patient Position: Sitting, Cuff Size: Normal)   Pulse 68   Ht 6\' 2"  (1.88 m)   Wt 225 lb 3.2 oz (102.2 kg)   SpO2 91%   BMI 28.91 kg/m    Review of Systems He denies hypoglycemia.      Objective:   Physical Exam VITAL SIGNS:  See  vs page GENERAL: no distress Pulses: dorsalis pedis intact bilat.   MSK: no deformity of the feet CV: no leg edema Skin:  no ulcer on the feet.  normal temp on the feet.  Vitiligo is noted.  Neuro: sensation is intact to touch on the feet.   Ext: There is bilateral onychomycosis of the toenails.    Lab Results  Component Value Date   ALT 20 04/30/2016   AST 17 04/30/2016   ALKPHOS 104 04/30/2016   BILITOT 0.7 04/30/2016     Lab Results  Component Value Date   HGBA1C 10.5 (A) 06/23/2018       Assessment & Plan:  Type 1 DM, with PN: worse Onychomycosis: persistent.   Patient Instructions  check your blood sugar twice a day.  vary the time of day when you check, between before the 3 meals, and at bedtime.  also check if you have symptoms of your blood sugar being too high or too low.  please keep a record of the readings and bring it to your next appointment here.  please call us sooner if your blood sugar goes below 70, or if it stays over 150.    Please continue the same humalog, and: I have sent a prescription to your pharmacy, to increase the tresiba to 70 units per day I have sent a prescription to your pharmacy, for the freestyle libre continuous glucose monitor.  Please let us know if you need help operating this.   Blood tests are requested for you today.  We'll let you know about the results.  I have sent a prescription to your pharmacy, for the toenail fungus.  Please come back for a follow-up appointment in 2 months.    controle su nivel de azcar en la [employer] al da. vare la hora del da cuando realiza la comprobacin, entre antes de las 3 comidas y antes de [place]. Tambin verifique si tiene sntomas de que su nivel de azcar en la sangre es demasiado alto o [place]. mantenga un registro de las lecturas y Nurse, adult a su prxima cita aqu. llmenos antes si su nivel de azcar en la sangre est por debajo de 70, o si se mantiene por encima de 150.  Contine con  el mismo humalog y: He enviado una receta a su farmacia, para aumentar el tresiba a 70 unidades por da. He enviado una receta a su farmacia, para el monitor de glucosa continuo freestyle [place]. Por favor, hganos saber si necesita ayuda para operar esto. Hoy se solicitan anlisis de sangre para usted. Le informaremos [employer]. He enviado una receta a su farmacia para el hongo de la ua del pie. Regrese para una cita de seguimiento en 2 meses.

## 2018-06-23 NOTE — Patient Instructions (Addendum)
check your blood sugar twice a day.  vary the time of day when you check, between before the 3 meals, and at bedtime.  also check if you have symptoms of your blood sugar being too high or too low.  please keep a record of the readings and bring it to your next appointment here.  please call us sooner if your blood sugar goes below 70, or if it stays over 150.    Please continue the same humalog, and: I have sent a prescription to your pharmacy, to increase the tresiba to 70 units per day I have sent a prescription to your pharmacy, for the freestyle libre continuous glucose monitor.  Please let us know if you need help operating this.   Blood tests are requested for you today.  We'll let you know about the results.  I have sent a prescription to your pharmacy, for the toenail fungus.  Please come back for a follow-up appointment in 2 months.    controle su nivel de azcar en la [employer] al da. vare la hora del da cuando realiza la comprobacin, entre antes de las 3 comidas y antes de [place]. Tambin verifique si tiene sntomas de que su nivel de azcar en la sangre es demasiado alto o [place]. mantenga un registro de las lecturas y Nurse, adult a su prxima cita aqu. llmenos antes si su nivel de azcar en la sangre est por debajo de 70, o si se mantiene por encima de 150. Contine con el mismo humalog y: He enviado una receta a su farmacia, para aumentar el tresiba a 70 unidades por da. He enviado una receta a su farmacia, para el monitor de glucosa continuo freestyle [place]. Por favor, hganos saber si necesita ayuda para operar esto. Hoy se solicitan anlisis de sangre para usted. Le informaremos [employer]. He enviado una receta a su farmacia para el hongo de la ua del pie. Regrese para una cita de seguimiento en 2 meses.

## 2018-06-24 LAB — HEPATIC FUNCTION PANEL
ALT: 17 IU/L (ref 0–44)
AST: 16 IU/L (ref 0–40)
Albumin: 4.1 g/dL (ref 3.8–4.9)
Alkaline Phosphatase: 121 IU/L — ABNORMAL HIGH (ref 39–117)
Bilirubin Total: 0.4 mg/dL (ref 0.0–1.2)
Bilirubin, Direct: 0.13 mg/dL (ref 0.00–0.40)
Total Protein: 6.1 g/dL (ref 6.0–8.5)

## 2018-06-27 ENCOUNTER — Telehealth: Payer: Self-pay | Admitting: Endocrinology

## 2018-06-27 NOTE — Telephone Encounter (Signed)
Closed as duplicate 

## 2018-06-27 NOTE — Telephone Encounter (Signed)
Patients wife is calling back in regards to lab results. Please Advise, thanks

## 2018-07-03 ENCOUNTER — Telehealth: Payer: Self-pay | Admitting: Endocrinology

## 2018-07-03 NOTE — Telephone Encounter (Signed)
Patients wife called stating she would like to make an appointment with the diabetic educator for her husband to get training on his sensors.

## 2018-07-03 NOTE — Telephone Encounter (Signed)
Can you please schedule this

## 2018-07-04 NOTE — Telephone Encounter (Signed)
Message left on machine that I have time next Monday at 8 or 8:30, 11, or 11:30.  Gave number to call me back with preference.

## 2018-07-05 NOTE — Telephone Encounter (Signed)
Wife reports that husband "figured out how to set up the device and he is now wearing the sensor.  She had no questions for me about it at this time.

## 2018-08-18 ENCOUNTER — Telehealth: Payer: Self-pay | Admitting: Endocrinology

## 2018-08-18 ENCOUNTER — Other Ambulatory Visit: Payer: Self-pay

## 2018-08-18 MED ORDER — INSULIN DEGLUDEC 100 UNIT/ML ~~LOC~~ SOPN: 70.0000 [IU] | PEN_INJECTOR | Freq: Every day | SUBCUTANEOUS | 11 refills | Status: DC

## 2018-08-18 NOTE — Telephone Encounter (Signed)
Rx sent 

## 2018-08-18 NOTE — Telephone Encounter (Signed)
Patients wife stated that [facility] did not receive the new RX with the new dosage of  insulin degludec (TRESIBA FLEXTOUCH) 100 UNIT/ML SOPN FlexTouch Pen  Please Advise, Thanks

## 2018-09-08 ENCOUNTER — Ambulatory Visit: Payer: Managed Care, Other (non HMO) | Admitting: Endocrinology

## 2018-09-08 ENCOUNTER — Encounter: Payer: Self-pay | Admitting: Endocrinology

## 2018-09-08 ENCOUNTER — Other Ambulatory Visit: Payer: Self-pay

## 2018-09-08 VITALS — BP 124/82 | HR 76 | Wt 228.2 lb

## 2018-09-08 DIAGNOSIS — E1142 Type 2 diabetes mellitus with diabetic polyneuropathy: Secondary | ICD-10-CM | POA: Diagnosis not present

## 2018-09-08 LAB — POCT GLYCOSYLATED HEMOGLOBIN (HGB A1C): Hemoglobin A1C: 9.8 % — AB (ref 4.0–5.6)

## 2018-09-08 MED ORDER — INSULIN DEGLUDEC 100 UNIT/ML ~~LOC~~ SOPN: 65.0000 [IU] | PEN_INJECTOR | Freq: Every day | SUBCUTANEOUS | 11 refills | Status: DC

## 2018-09-08 MED ORDER — INSULIN LISPRO (1 UNIT DIAL) 100 UNIT/ML (KWIKPEN): 8.0000 [IU] | PEN_INJECTOR | Freq: Three times a day (TID) | SUBCUTANEOUS | 11 refills | Status: DC

## 2018-09-08 NOTE — Progress Notes (Signed)
Subjective:    Patient ID: Daniel [name], male    DOB: 08-27-1955, 63 y.o.   MRN: [id number]  HPI Pt returns for f/u of diabetes mellitus: DM type: 1 is dx'ed, due to vitiligo, lean body habitus, and failure of oral rx.   Dx'ed: 2010 Complications: polyneuropathy.  Therapy: insulin since 2017.   DKA: never.  Severe hypoglycemia: never.  Pancreatitis: never.   Other: he takes multiple daily injections, but basal insulin is emphasized for simplicity, as he did not do well with more bolus insulin. he works 3rd shift; he declines pump; fructosamine and cbg's have shown approx 1.5% pts better glycemic control than A1c itself.   Interval history: no recent steroids.  I reviewed continuous glucose monitor data.  Glucose varies from 50-400's.  It is lowest fasting, and in the afternoon (pt says this is due to working 3rd shift).  He has been off novolog x 2 weeks, as he is waiting for refill to come in the mail.  He says he does not me to resend rx, though.   Wife asks why glucose is so variable.   Past Medical History:  Diagnosis Date  . Diabetes mellitus 05/17/07  . DJD (degenerative joint disease) of knee    Right knee, end stage, injected 09/2014, Dr. Jerl Santos  . Flexor tenosynovitis of finger    Left, Dr. Jerl Santos  . Hyperlipidemia   . Knee pain    w/u included a MRI of the knee (-) and back, dx w/ Spinal Stenosis (believed to be causing knee pain)  . Normal cardiac stress test 09/2007  . Shoulder impingement    Right, Dr. Jerl Santos  . Vitiligo    dx in the 90s    Past Surgical History:  Procedure Laterality Date  . FINGER SURGERY    . SHOULDER SURGERY  09-2010   Left, arthroscopy    Social History   Socioeconomic History  . Marital status: Married    Spouse name: Not on file  . Number of children: 2  . Years of education: Not on file  . Highest education level: Not on file  Occupational History  . 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 Financial resource  strain: Not on file  . Food insecurity:    Worry: Not on file    Inability: Not on file  . Transportation needs:    Medical: Not on file    Non-medical: Not on file  Tobacco Use  . Smoking status: Former Smoker    Packs/day: 0.10    Types: Cigarettes    Last attempt to quit: 05/25/2011    Years since quitting: 7.3  . Smokeless tobacco: Never Used  Substance and Sexual Activity  . Alcohol use: Yes    Comment: rare  . Drug use: No  . Sexual activity: Not on file  Lifestyle  . Physical activity:    Days per week: Not on file    Minutes per session: Not on file  . Stress: Not on file  Relationships  . Social connections:    Talks on phone: Not on file    Gets together: Not on file    Attends religious service: Not on file    Active member of club or organization: Not on file    Attends meetings of clubs or organizations: Not on file    Relationship status: Not on file  . Intimate partner violence:    Fear of current or ex partner: Not on file  Emotionally abused: Not on file    Physically abused: Not on file    Forced sexual activity: Not on file  Other Topics Concern  . Not on file  Social History Narrative   From [place]     Current Outpatient Medications on File Prior to Visit  Medication Sig Dispense Refill  . Continuous Blood Gluc Sensor (FREESTYLE LIBRE 14 DAY SENSOR) MISC 1 Device by Does not apply route every 14 (fourteen) days. 6 each 3  . ondansetron (ZOFRAN) 4 MG tablet Take 1 tablet (4 mg total) by mouth every 8 (eight) hours as needed. 20 tablet 0  . ONE TOUCH LANCETS MISC by Does not apply route 4 (four) times daily.      Marland Kitchen. terbinafine (LAMISIL) 250 MG tablet Take 1 tablet (250 mg total) by mouth daily. 90 tablet 0   No current facility-administered medications on file prior to visit.     No Known Allergies  Family History  Problem Relation Age of Onset  . Coronary artery disease Father        mid 8880s  . Diabetes Father   . Hypertension Mother    . Stroke Mother   . Diabetes Mother   . Colon cancer Neg Hx   . Prostate cancer Neg Hx     BP 124/82   Pulse 76   Wt 228 lb 3.2 oz (103.5 kg)   SpO2 96%   BMI 29.30 kg/m    Review of Systems Denies LOC.      Objective:   Physical Exam VITAL SIGNS:  See vs page GENERAL: no distress Pulses: dorsalis pedis intact bilat.   MSK: no deformity of the feet CV: trace bilat leg edema, and bilat vv's of the ankles Skin:  no ulcer on the feet.  normal color and temp on the feet. Neuro: sensation is intact to touch on the feet.   Ext: There is bilateral onychomycosis of the toenails.    Lab Results  Component Value Date   HGBA1C 9.8 (A) 09/08/2018       Assessment & Plan:  Type 1 DM, with PN: I discussed with wife the fact that more bolus insulin would flatten daily glucose curve, but he did poorly on this regimen.    Patient Instructions  check your blood sugar twice a day.  vary the time of day when you check, between before the 3 meals, and at bedtime.  also check if you have symptoms of your blood sugar being too high or too low.  please keep a record of the readings and bring it to your next appointment here.  please call us sooner if your blood sugar goes below 70, or if it stays over 150.    Please change the insulins to the numbers listed below Best wishes with your new doctor.    controle su nivel de azcar en la [employer] dos veces al da. vare la hora del da cuando realiza la comprobacin, entre antes de las 3 comidas y antes de [place]. Tambin verifique si tiene sntomas de que su nivel de azcar en la sangre es demasiado alto o [place] bajo. mantenga un registro de las lecturas y Nurse, adulttrigalo a su prxima cita aqu. llmenos antes si su nivel de azcar en la sangre es inferior a 70 o si se mantiene por encima de 150. Cambie las insulinas a los nmeros que se enumeran a continuacin. Lo mejor es desearlo con su nuevo mdico.

## 2018-09-08 NOTE — Patient Instructions (Addendum)
check your blood sugar twice a day.  vary the time of day when you check, between before the 3 meals, and at bedtime.  also check if you have symptoms of your blood sugar being too high or too low.  please keep a record of the readings and bring it to your next appointment here.  please call us sooner if your blood sugar goes below 70, or if it stays over 150.    Please change the insulins to the numbers listed below Best wishes with your new doctor.    controle su nivel de azcar en la [employer] al da. vare la hora del da cuando realiza la comprobacin, entre antes de las 3 comidas y antes de [place]. Tambin verifique si tiene sntomas de que su nivel de azcar en la sangre es demasiado alto o [place]. mantenga un registro de las lecturas y Nurse, adult a su prxima cita aqu. llmenos antes si su nivel de azcar en la sangre es inferior a 70 o si se mantiene por encima de 150. Cambie las insulinas a los nmeros que se enumeran a continuacin. Lo mejor es desearlo con su nuevo mdico.

## 2018-09-21 ENCOUNTER — Telehealth: Payer: Self-pay

## 2018-09-21 NOTE — Telephone Encounter (Signed)
Copied from CRM [id number]. Topic: Appointment Scheduling - Scheduling Inquiry for Clinic >> Sep 20, 2018  5:06 PM Marylen Ponto wrote: Reason for CRM: Pt wife stated pt needs an appt to discuss Rx for Viagra. Cb# [phone]

## 2018-09-21 NOTE — Telephone Encounter (Signed)
Called patient to schedule appointment. Left message for return call. 

## 2018-09-28 DIAGNOSIS — S92061D Displaced intraarticular fracture of right calcaneus, subsequent encounter for fracture with routine healing: Secondary | ICD-10-CM | POA: Insufficient documentation

## 2018-10-17 ENCOUNTER — Encounter: Payer: Self-pay | Admitting: Gastroenterology

## 2018-10-25 ENCOUNTER — Telehealth: Payer: Self-pay | Admitting: Endocrinology

## 2018-10-25 NOTE — Telephone Encounter (Signed)
Referral is not required from specialist. Pt can either schedule appt with endocrinologist closer to home OR contact PCP for referral (if required by insurance). Once scheduled, pt will need to sign records release with new specialist, fax to our office and signed request will be forwarded to Medical Records for them to process medical records request.

## 2018-10-25 NOTE — Telephone Encounter (Signed)
Wife returned call. Informed referral is not required from our office. Generally referrals to a new specialist will come from PCP (if required by insurance). Reminded, once established, ensure a medical records release is signed and records can be faxed to new specialist. Verbalized acceptance and understanding.

## 2018-10-25 NOTE — Telephone Encounter (Signed)
Patient's wife France Ravens called re: request Patient get a Referral to a new Endocrinologist closer to home-Dr. Izell Lubeck -[address] [place], [place] [address] Ph# [phone]

## 2018-12-22 ENCOUNTER — Encounter: Payer: Self-pay | Admitting: Internal Medicine

## 2019-01-22 DIAGNOSIS — E109 Type 1 diabetes mellitus without complications: Secondary | ICD-10-CM | POA: Insufficient documentation

## 2019-04-24 ENCOUNTER — Other Ambulatory Visit: Payer: Self-pay | Admitting: Endocrinology

## 2019-08-01 LAB — HM DIABETES EYE EXAM

## 2020-02-12 LAB — BASIC METABOLIC PANEL: Glucose: 151

## 2020-02-26 LAB — LIPID PANEL
Cholesterol: 100 (ref 0–200)
HDL: 48 (ref 35–70)
LDL Cholesterol: 100
Triglycerides: 47 (ref 40–160)

## 2020-02-26 LAB — MICROALBUMIN, URINE: Microalb, Ur: 135

## 2020-03-13 LAB — BASIC METABOLIC PANEL
BUN: 13 (ref 4–21)
CO2: 29 — AB (ref 13–22)
Chloride: 105 (ref 99–108)
Creatinine: 0.6 (ref 0.6–1.3)
Potassium: 4.2 (ref 3.4–5.3)
Sodium: 139 (ref 137–147)

## 2020-03-13 LAB — COMPREHENSIVE METABOLIC PANEL
Albumin: 4.1 (ref 3.5–5.0)
Calcium: 9 (ref 8.7–10.7)
GFR calc non Af Amer: >90

## 2020-03-13 LAB — TSH: TSH: 2.44 (ref 0.41–5.90)

## 2020-03-13 LAB — HEPATIC FUNCTION PANEL
ALT: 20 (ref 10–40)
AST: 18 (ref 14–40)
Alkaline Phosphatase: 86 (ref 25–125)
Bilirubin, Total: 0.8

## 2020-05-14 LAB — HEMOGLOBIN A1C: Hemoglobin A1C: 8.2

## 2020-08-27 ENCOUNTER — Encounter: Payer: Self-pay | Admitting: Internal Medicine

## 2020-09-02 ENCOUNTER — Encounter: Payer: Self-pay | Admitting: Internal Medicine

## 2021-06-12 LAB — HM DIABETES FOOT EXAM: HM Diabetic Foot Exam: NORMAL

## 2021-08-05 LAB — HEMOGLOBIN A1C: Hemoglobin A1C: 7.7

## 2021-08-17 DIAGNOSIS — G63 Polyneuropathy in diseases classified elsewhere: Secondary | ICD-10-CM | POA: Insufficient documentation

## 2021-09-15 DIAGNOSIS — M5136 Other intervertebral disc degeneration, lumbar region: Secondary | ICD-10-CM | POA: Insufficient documentation

## 2021-10-01 HISTORY — PX: FOOT ARTHRODESIS, SUBTALAR: SUR53

## 2021-12-29 LAB — HEMOGLOBIN A1C: Hemoglobin A1C: 9.2

## 2022-01-05 LAB — PROTEIN / CREATININE RATIO, URINE
Albumin, U: 9
Creatinine, Urine: 161

## 2022-03-31 LAB — HEMOGLOBIN A1C: Hemoglobin A1C: 8.4

## 2022-05-26 ENCOUNTER — Telehealth: Payer: Self-pay

## 2022-05-26 NOTE — Telephone Encounter (Signed)
Yes, thank you.

## 2022-05-26 NOTE — Telephone Encounter (Signed)
Patient's wife called to see if he can reestablish care with Dr. Larose Kells. Please advise

## 2022-05-26 NOTE — Telephone Encounter (Signed)
Okay to schedule NP appt. Thank you,

## 2022-05-26 NOTE — Telephone Encounter (Signed)
Please advise 

## 2022-05-27 ENCOUNTER — Telehealth: Payer: Self-pay

## 2022-05-27 NOTE — Telephone Encounter (Signed)
LVM to advise patient to callback and schedule appt to reestablish care with DR. Larose Kells.

## 2022-06-07 ENCOUNTER — Encounter: Payer: Self-pay | Admitting: Internal Medicine

## 2022-06-07 ENCOUNTER — Ambulatory Visit: Payer: 59 | Admitting: Internal Medicine

## 2022-06-07 ENCOUNTER — Ambulatory Visit (HOSPITAL_BASED_OUTPATIENT_CLINIC_OR_DEPARTMENT_OTHER)
Admission: RE | Admit: 2022-06-07 | Discharge: 2022-06-07 | Disposition: A | Discharge status: Home / Self Care | Payer: 59 | Source: Ambulatory Visit | Attending: Internal Medicine | Admitting: Internal Medicine

## 2022-06-07 VITALS — BP 120/68 | HR 58 | Temp 97.5°F | Resp 18 | Ht 74.0 in | Wt 254.1 lb

## 2022-06-07 DIAGNOSIS — M545 Low back pain, unspecified: Secondary | ICD-10-CM

## 2022-06-07 NOTE — Assessment & Plan Note (Signed)
New patient  Last seen 2018, has been getting his care at [facility].  Here to reestablish Recent labs: A1c 8.4, LDL 71, creatinine 0.6. Lumbalgia: Started 3 months ago, getting gradually worse. Plan:  -X-rays (addendum: Unable to proceed with x-ray, patient declined need to take off his insulin device) -Refer to Ortho.   - Has taken OTCs as needed.  Recommend Tylenol every 8 hours and ibuprofen for residual pain once a day with GI precautions. RTC 3 months for a checkup

## 2022-06-07 NOTE — Patient Instructions (Addendum)
Will refer you to a orthopedic doctor     Tylenol  500 mg OTC 2 tabs a day every 8 hours as needed for pain  IBUPROFEN (Advil or Motrin) 200 mg 2- 3  tablets once a day .  Always take it with food because may cause gastritis and ulcers.  If you notice nausea, stomach pain, change in the color of stools --->  Stop the medicine and let us know     GO TO THE FRONT DESK, PLEASE SCHEDULE YOUR APPOINTMENTS Come back for   a check up in 3 months       Vaccines I recommend:  Shingrix (shingles) Covid booster RSV vaccine   Aparentemente ya es tiempo de hacerse un examen de los ojos. Por favor llame a su doctor de los ojos (ophthalmologist, optometrist) y saque una cita. Solicite que nos envien una copia de la visita a nuestro fax: [phone]. Si necesitara un "referral" nosotros lo podemos hacer

## 2022-06-07 NOTE — Progress Notes (Signed)
Subjective:    Patient ID: Daniel [name], male    DOB: 1955/08/21, 67 y.o.   MRN: [id number]  DOS:  06/07/2022 Type of visit - description: New patient, LOV 2018.  Since the last office visit in 2018 he has been getting his care mostly at [facility]. Sees endocrinology there Saw orthopedics due to a old injury on the ankle, eventually requiring surgery.  He is here today to establish. His main concern is left-sided low back pain. Started 3 months ago, gradually getting worse. Not related to any recent injury that he can tell. Worse when he gets up or walk. Denies fever chills No bladder or bowel incontinence. Denies radiation or paresthesias although from time to time he has tingling on his right toes since he had R ankle surgery.  Few days ago the pain was very severe went to urgent care, prescribed Celebrex which is not helping.   Review of Systems See above   Past Medical History:  Diagnosis Date   Diabetes mellitus 05/17/07   DJD (degenerative joint disease) of knee    Right knee, end stage, injected 09/2014, Dr. Rhona Raider   Flexor tenosynovitis of finger    Left, Dr. Rhona Raider   Hyperlipidemia    Knee pain    w/u included a MRI of the knee (-) and back, dx w/ Spinal Stenosis (believed to be causing knee pain)   Normal cardiac stress test 09/2007   Shoulder impingement    Right, Dr. 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  Tobacco Use   Smoking status: Former    Packs/day: 0.10    Types: Cigarettes    Quit date: 05/25/2011    Years  since quitting: 11.0   Smokeless tobacco: Never   Tobacco comments:    Quit 2013  Substance and Sexual Activity   Alcohol use: Yes    Comment: rare   Drug use: No   Sexual activity: Not on file  Other Topics Concern   Not on file  Social History Narrative   From [place]     Social Determinants of Health   Financial Resource Strain: Not on file  Food Insecurity: Not on file  Transportation Needs: Not on file  Physical Activity: Not on file  Stress: Not on file  Social Connections: Not on file  Intimate Partner Violence: Not on file   Family History  Problem Relation Age of Onset   Coronary artery disease Father        mid 62s   Diabetes Father    Hypertension Mother    Stroke Mother    Diabetes Mother    Colon cancer Neg Hx    Prostate cancer Neg Hx     Current Outpatient Medications  Medication Instructions   atorvastatin (LIPITOR) 40 MG tablet 1 tablet, Oral, Daily   celecoxib (CELEBREX) 200 mg, Oral, 2 times daily   Continuous Blood Gluc Sensor (FREESTYLE LIBRE 14 DAY SENSOR) MISC 1 Device, Does not apply,  Every 14 days   Insulin Disposable Pump (OMNIPOD 5 G6 INTRO, GEN 5,) KIT Subcutaneous   methocarbamol (ROBAXIN) 500 mg, Oral, 3 times daily PRN       Objective:   Physical Exam BP 120/68   Pulse (!) 58   Temp (!) 97.5 F (36.4 C) (Oral)   Resp 18   Ht 6\' 2"  (1.88 m)   Wt 254 lb 2 oz (115.3 kg)   SpO2 97%   BMI 32.63 kg/m  General:   Well developed, NAD, BMI noted. HEENT:  Normocephalic . Face symmetric, atraumatic Lungs:  CTA B Normal respiratory effort, no intercostal retractions, no accessory muscle use. Heart: RRR,  no murmur.  Lower extremities: no pretibial edema bilaterally  Skin: Not pale. Not jaundice Neurologic:  alert & oriented X3.  Speech normal, gait and transferring: Antalgic. DTR symmetric Straight leg test negative Psych--  Cognition and judgment appear intact.  Cooperative with normal attention span and concentration.   Behavior appropriate. No anxious or depressed appearing.      Assessment   Assessment Diabetes- Dr Posey Pronto  Hyperlipidemia Vitiligo DJD Normal stress test 2009   PLAN  New patient  Last seen 2018, has been getting his care at [facility].  Here to reestablish Recent labs: A1c 8.4, LDL 71, creatinine 0.6. Lumbalgia: Started 3 months ago, getting gradually worse. Plan:  -X-rays (addendum: Unable to proceed with x-ray, patient declined need to take off his insulin device) -Refer to Ortho.   - Has taken OTCs as needed.  Recommend Tylenol every 8 hours and ibuprofen for residual pain once a day with GI precautions. RTC 3 months for a checkup

## 2022-08-03 LAB — HM DIABETES FOOT EXAM

## 2022-08-03 LAB — HEMOGLOBIN A1C: Hemoglobin A1C: 8.2

## 2022-09-24 ENCOUNTER — Encounter: Payer: Self-pay | Admitting: Internal Medicine

## 2022-09-27 ENCOUNTER — Ambulatory Visit: Payer: 59 | Admitting: Internal Medicine

## 2022-09-27 ENCOUNTER — Encounter: Payer: Self-pay | Admitting: Internal Medicine

## 2022-10-06 ENCOUNTER — Ambulatory Visit (HOSPITAL_BASED_OUTPATIENT_CLINIC_OR_DEPARTMENT_OTHER)
Admission: RE | Admit: 2022-10-06 | Discharge: 2022-10-06 | Disposition: A | Discharge status: Home / Self Care | Payer: 59 | Source: Ambulatory Visit | Attending: Internal Medicine | Admitting: Internal Medicine

## 2022-10-06 ENCOUNTER — Encounter: Payer: Self-pay | Admitting: Internal Medicine

## 2022-10-06 ENCOUNTER — Ambulatory Visit: Payer: 59 | Admitting: Internal Medicine

## 2022-10-06 VITALS — BP 122/70 | HR 67 | Temp 97.6°F | Resp 18 | Ht 74.0 in | Wt 248.5 lb

## 2022-10-06 DIAGNOSIS — R079 Chest pain, unspecified: Secondary | ICD-10-CM | POA: Diagnosis not present

## 2022-10-06 DIAGNOSIS — M7989 Other specified soft tissue disorders: Secondary | ICD-10-CM

## 2022-10-06 DIAGNOSIS — M79662 Pain in left lower leg: Secondary | ICD-10-CM | POA: Insufficient documentation

## 2022-10-06 DIAGNOSIS — E7800 Pure hypercholesterolemia, unspecified: Secondary | ICD-10-CM | POA: Diagnosis not present

## 2022-10-06 LAB — CBC WITH DIFFERENTIAL/PLATELET
Basophils Absolute: 0.1 10*3/uL (ref 0.0–0.1)
Basophils Relative: 1 % (ref 0.0–3.0)
Eosinophils Absolute: 0.4 10*3/uL (ref 0.0–0.7)
Eosinophils Relative: 5.4 % — ABNORMAL HIGH (ref 0.0–5.0)
HCT: 45.8 % (ref 39.0–52.0)
Hemoglobin: 15.3 g/dL (ref 13.0–17.0)
Lymphocytes Relative: 27.7 % (ref 12.0–46.0)
Lymphs Abs: 1.8 10*3/uL (ref 0.7–4.0)
MCHC: 33.5 g/dL (ref 30.0–36.0)
MCV: 84.6 fl (ref 78.0–100.0)
Monocytes Absolute: 0.5 10*3/uL (ref 0.1–1.0)
Monocytes Relative: 7.9 % (ref 3.0–12.0)
Neutro Abs: 3.8 10*3/uL (ref 1.4–7.7)
Neutrophils Relative %: 58 % (ref 43.0–77.0)
Platelets: 212 10*3/uL (ref 150.0–400.0)
RBC: 5.41 Mil/uL (ref 4.22–5.81)
RDW: 14.8 % (ref 11.5–15.5)
WBC: 6.5 10*3/uL (ref 4.0–10.5)

## 2022-10-06 LAB — COMPREHENSIVE METABOLIC PANEL
ALT: 42 U/L (ref 0–53)
AST: 31 U/L (ref 0–37)
Albumin: 4.3 g/dL (ref 3.5–5.2)
Alkaline Phosphatase: 115 U/L (ref 39–117)
BUN: 13 mg/dL (ref 6–23)
CO2: 27 mEq/L (ref 19–32)
Calcium: 9.3 mg/dL (ref 8.4–10.5)
Chloride: 104 mEq/L (ref 96–112)
Creatinine, Ser: 0.69 mg/dL (ref 0.40–1.50)
GFR: 97.89 mL/min (ref >60.00)
Glucose, Bld: 197 mg/dL — ABNORMAL HIGH (ref 70–99)
Potassium: 4.2 mEq/L (ref 3.5–5.1)
Sodium: 138 mEq/L (ref 135–145)
Total Bilirubin: 0.6 mg/dL (ref 0.2–1.2)
Total Protein: 6.9 g/dL (ref 6.0–8.3)

## 2022-10-06 NOTE — Progress Notes (Unsigned)
Subjective:    Patient ID: Daniel [name], male    DOB: 1956/04/04, 66 y.o.   MRN: [id number]  DOS:  10/06/2022 Type of visit - description: acute  About 10 days ago for a period of   3 days had chest pain: The pain is described as sharp, in the mid anterior chest, associated with some jaw discomfort  and salivation. Episodes lasted approximately 20 seconds, had on average 3 episodes a day. Last episode was a week ago. Symptoms were typically at rest when he was sitting down. He has a physical job and he also walks frequently with no chest pain. No associated shortness of breath, palpitations, nausea vomiting. No cough No GERD  Also, about 3 weeks history of left knee pain with some swelling. Calf is also swelling and tender. Denies any recent airplane teak or prolonged car trip.   Review of Systems See above   Past Medical History:  Diagnosis Date   Diabetes mellitus 05/17/07   DJD (degenerative joint disease) of knee    Right knee, end stage, injected 09/2014, Dr. Jerl Santos   Flexor tenosynovitis of finger    Left, Dr. Jerl Santos   Hyperlipidemia    Knee pain    w/u included a MRI of the knee (-) and back, dx w/ Spinal Stenosis (believed to be causing knee pain)   Normal cardiac stress test 09/2007   Shoulder impingement    Right, Dr. Jerl Santos   Vitiligo    dx in the 90s    Past Surgical History:  Procedure Laterality Date   FINGER SURGERY     FOOT ARTHRODESIS, SUBTALAR Right 10/01/2021   SHOULDER SURGERY  09/22/2010   Left, arthroscopy    Current Outpatient Medications  Medication Instructions   atorvastatin (LIPITOR) 40 MG tablet 1 tablet, Oral, Daily   Continuous Blood Gluc Sensor (FREESTYLE LIBRE 14 DAY SENSOR) MISC 1 Device, Does not apply, Every 14 days   Insulin Disposable Pump (OMNIPOD 5 G6 INTRO, GEN 5,) KIT Subcutaneous   insulin lispro (HUMALOG) 100 UNIT/ML KwikPen As directed. Max daily dose is 50 units   methocarbamol (ROBAXIN) 500 mg, 3 times  daily PRN       Objective:   Physical Exam BP 122/70   Pulse 67   Temp 97.6 F (36.4 C) (Oral)   Resp 18   Ht 6\' 2"  (1.88 m)   Wt 248 lb 8 oz (112.7 kg)   SpO2 95%   BMI 31.91 kg/m  General:   Well developed, NAD, BMI noted.  HEENT:  Normocephalic . Face symmetric, atraumatic Lungs:  CTA B Normal respiratory effort, no intercostal retractions, no accessory muscle use. Chest wall: No TTP  heart: RRR,  no murmur.  Abdomen:  Not distended, soft, non-tender. No rebound or rigidity.   Skin: Not pale. Not jaundice Lower extremities:  R knee: Normal L knee: Small effusion noted, slightly TTP, popliteal site slightly swollen. L calf slightly larger by half inch in circumference.  Not red, not warm, slightly TTP Neurologic:  alert & oriented X3.  Speech normal, gait appropriate for age and unassisted Psych--  Cognition and judgment appear intact.  Cooperative with normal attention span and concentration.  Behavior appropriate. No anxious or depressed appearing.     Assessment   ASSESSMENT Diabetes- Dr Allena Katz  Hyperlipidemia Vitiligo DJD Normal stress test 2009   PLAN  Chest pain: For 3 days had atypical chest pain, last episode a week ago.  The patient is high risk for CAD.  EKG today: NSR, no acute changes, no changes compared to previous EKG. Plan:  CMP, CBC, Myoview.  If severe or different chest pain: Go to the ER. Start aspirin 81 mg daily. DM: Per Endo, last A1c 8.2. L leg swelling: Must rule out DVT, get ultrasound today. Dyslipidemia:  On atorvastatin. All instructions were clearly discussed in Spanish, he verbalized understanding. RTC 2 months

## 2022-10-06 NOTE — Patient Instructions (Addendum)
  Blood work today before you leave  Come back in 2 months for a checkup, please make an appointment before you leave.  Go to the first floor and get a ultrasound of your left leg to rule out a clot.  Start taking aspirin 81 mg 1 tablet every day  We are scheduling stress test, they should be calling you soon.  If you have severe pain, difficulty breathing: Go to the ER     Aparentemente ya es tiempo de hacerse un examen de los ojos. Por favor llame a su doctor de los ojos (ophthalmologist, optometrist) y saque una cita. Solicite que nos envien una copia de la visita a nuestro fax: [phone]. Si necesitara un "referral" nosotros lo podemos hacer

## 2022-10-07 NOTE — Assessment & Plan Note (Addendum)
Chest pain: For 3 days had atypical chest pain, last episode a week ago.  The patient is high risk for CAD. EKG today: NSR, no acute changes, no changes compared to previous EKG. Plan:  CMP, CBC, Myoview.  If severe or different chest pain: Go to the ER. Start aspirin 81 mg daily. DM: Per Endo, last A1c 8.2. L leg swelling: Must rule out DVT, get ultrasound today. Dyslipidemia:  On atorvastatin. All instructions were clearly discussed in Spanish, he verbalized understanding. RTC 2 months

## 2022-10-14 ENCOUNTER — Telehealth: Payer: Self-pay | Admitting: Internal Medicine

## 2022-10-14 ENCOUNTER — Telehealth: Payer: Self-pay

## 2022-10-14 NOTE — Addendum Note (Signed)
Addended byConrad Ohkay Owingeh D on: 10/14/2022 10:26 AM   Modules accepted: Orders

## 2022-10-14 NOTE — Telephone Encounter (Signed)
Patient's wife called stating they called the place where he needs to get his Myocardial Perfusion and they told her that the order is wrong and that the office needs to call them to fix it/schedule it. Please advise.

## 2022-10-14 NOTE — Telephone Encounter (Signed)
Order reviewed, don't see anything different then the way its usually ordered. However, order replaced.

## 2022-10-14 NOTE — Telephone Encounter (Signed)
LMOM informing Daniel [name], Pt's wife that she was given the wrong number to schedule (someone gave Pt number to central scheduling)- looking at referral notes for stress test cardiology had tried calling on 5/17 and 5/22 and left voicemails to call back, I left another voicemail with the correct cardiology number which is [phone] to schedule the stress test.

## 2022-10-14 NOTE — Telephone Encounter (Signed)
Patient's wife is calling to get the myocardial imaging scheduled

## 2022-10-15 ENCOUNTER — Ambulatory Visit (HOSPITAL_COMMUNITY): Payer: 59 | Attending: Internal Medicine

## 2022-10-15 DIAGNOSIS — R079 Chest pain, unspecified: Secondary | ICD-10-CM | POA: Diagnosis present

## 2022-10-15 LAB — MYOCARDIAL PERFUSION IMAGING
LV dias vol: 116 mL (ref 62–150)
LV sys vol: 46 mL
Nuc Stress EF: 61 %
Peak HR: 81 {beats}/min
Rest HR: 54 {beats}/min
Rest Nuclear Isotope Dose: 10.8 mCi
SDS: 3
SRS: 0
SSS: 3
ST Depression (mm): 0 mm
Stress Nuclear Isotope Dose: 31.2 mCi
TID: 1.06

## 2022-10-15 MED ORDER — REGADENOSON 0.4 MG/5ML IV SOLN
0.4000 mg | Freq: Once | INTRAVENOUS | Status: AC
  Administered 2022-10-15: 0.4 mg via INTRAVENOUS

## 2022-10-15 MED ORDER — TECHNETIUM TC 99M TETROFOSMIN IV KIT
31.2000 | PACK | Freq: Once | INTRAVENOUS | Status: AC | PRN
  Administered 2022-10-15: 31.2 via INTRAVENOUS

## 2022-10-15 MED ORDER — TECHNETIUM TC 99M TETROFOSMIN IV KIT
10.8000 | PACK | Freq: Once | INTRAVENOUS | Status: AC | PRN
  Administered 2022-10-15: 10.8 via INTRAVENOUS

## 2022-11-17 LAB — PROTEIN / CREATININE RATIO, URINE
Albumin, U: 10
Creatinine, Urine: 144

## 2022-12-06 ENCOUNTER — Encounter: Payer: Self-pay | Admitting: Internal Medicine

## 2022-12-06 ENCOUNTER — Ambulatory Visit: Payer: 59 | Admitting: Internal Medicine

## 2023-02-07 LAB — HEMOGLOBIN A1C: Hemoglobin A1C: 8.2

## 2023-04-06 ENCOUNTER — Encounter: Payer: Self-pay | Admitting: Internal Medicine

## 2023-07-20 LAB — HM DIABETES FOOT EXAM

## 2023-07-20 LAB — HEMOGLOBIN A1C: Hemoglobin A1C: 9

## 2023-09-08 ENCOUNTER — Encounter: Payer: Self-pay | Admitting: Internal Medicine

## 2023-12-22 LAB — MICROALBUMIN / CREATININE URINE RATIO: Microalb Creat Ratio: 12

## 2023-12-22 LAB — TSH: TSH: 2.84 (ref 0.41–5.90)

## 2023-12-22 LAB — PROTEIN / CREATININE RATIO, URINE
Albumin, U: 21
Creatinine, Urine: 175

## 2023-12-22 LAB — HEPATIC FUNCTION PANEL
ALT: 30 U/L (ref 10–40)
AST: 28 (ref 14–40)
Alkaline Phosphatase: 107 (ref 25–125)
Bilirubin, Total: 0.5

## 2023-12-22 LAB — COMPREHENSIVE METABOLIC PANEL WITH GFR
Albumin: 4.3 (ref 3.5–5.0)
Calcium: 9.2 (ref 8.7–10.7)
eGFR: >90

## 2023-12-22 LAB — HEMOGLOBIN A1C: Hemoglobin A1C: 9.2

## 2023-12-22 LAB — LIPID PANEL
Cholesterol: 152 (ref 0–200)
HDL: 60 (ref 35–70)
LDL Cholesterol: 78
Triglycerides: 56 (ref 40–160)

## 2023-12-22 LAB — BASIC METABOLIC PANEL WITH GFR
BUN: 12 (ref 4–21)
CO2: 27 — AB (ref 13–22)
Chloride: 107 (ref 99–108)
Creatinine: 0.6 (ref 0.6–1.3)
Glucose: 184
Potassium: 4.2 meq/L (ref 3.5–5.1)
Sodium: 142 (ref 137–147)

## 2024-01-11 ENCOUNTER — Telehealth: Payer: Self-pay

## 2024-01-11 NOTE — Telephone Encounter (Signed)
 Received surgery clearance from [facility]. Pt is needing a R TKA w/ Dr Duwaine. Surgery date to be determined.    Levorn or Damien- can you guys try calling Pt to get him scheduled for a surgery clearance with Dr. Amon please?

## 2024-01-11 NOTE — Telephone Encounter (Signed)
 Patient called and scheduled to come in Friday 8/29 at 2 pm.

## 2024-01-19 ENCOUNTER — Encounter: Payer: Self-pay | Admitting: Internal Medicine

## 2024-01-20 ENCOUNTER — Encounter: Payer: Self-pay | Admitting: Internal Medicine

## 2024-01-20 ENCOUNTER — Ambulatory Visit: Admitting: Internal Medicine

## 2024-01-20 VITALS — BP 122/70 | HR 69 | Temp 97.9°F | Resp 16 | Ht 74.0 in | Wt 256.2 lb

## 2024-01-20 DIAGNOSIS — M150 Primary generalized (osteo)arthritis: Secondary | ICD-10-CM | POA: Diagnosis not present

## 2024-01-20 DIAGNOSIS — Z01818 Encounter for other preprocedural examination: Secondary | ICD-10-CM

## 2024-01-20 DIAGNOSIS — E109 Type 1 diabetes mellitus without complications: Secondary | ICD-10-CM

## 2024-01-20 DIAGNOSIS — M1990 Unspecified osteoarthritis, unspecified site: Secondary | ICD-10-CM | POA: Insufficient documentation

## 2024-01-20 NOTE — Telephone Encounter (Signed)
 Form completed and faxed to [facility] at [phone]. Form sent for scanning.

## 2024-01-20 NOTE — Patient Instructions (Signed)
 Please restart aspirin 81 mg daily but  stop 1 week prior to your surgery  Will see that flu vaccine and a COVID booster this fall    Go to the front desk for the checkout Please make an appointment for a physical exam in 4 months

## 2024-01-20 NOTE — Progress Notes (Signed)
   Subjective:    Patient ID: Daniel [name] Code, male    DOB: 1956/04/10, 68 y.o.   MRN: [id number]  DOS:  01/20/2024 Type of visit - description: Preop evaluation  Severe DJD, to have a right total knee replacement  He feels well. Denies chest pain, difficulty breathing, no palpitations.  No edema.  Review of Systems See above   Past Medical History:  Diagnosis Date   Diabetes mellitus 05/17/07   DJD (degenerative joint disease) of knee    Right knee, end stage, injected 09/2014, Dr. Sheril   Flexor tenosynovitis of finger    Left, Dr. Sheril   Hyperlipidemia    Knee pain    w/u included a MRI of the knee (-) and back, dx w/ Spinal Stenosis (believed to be causing knee pain)   Normal cardiac stress test 09/2007   Shoulder impingement    Right, Dr. Sheril   Vitiligo    dx in the 90s    Past Surgical History:  Procedure Laterality Date   FINGER SURGERY     FOOT ARTHRODESIS, SUBTALAR Right 10/01/2021   SHOULDER SURGERY  09/22/2010   Left, arthroscopy    Current Outpatient Medications  Medication Instructions   aspirin EC 81 mg, Daily   atorvastatin (LIPITOR) 40 MG tablet 1 tablet, Daily   Continuous Blood Gluc Sensor (FREESTYLE LIBRE 14 DAY SENSOR) MISC 1 Device, Does not apply, Every 14 days   HUMALOG  100 UNIT/ML cartridge Insulin  pump   Insulin  Disposable Pump (OMNIPOD 5 G6 INTRO, GEN 5,) KIT Inject into the skin.   ipratropium (ATROVENT) 0.03 % nasal spray 2 sprays, 2 times daily   pregabalin (LYRICA) 50 mg, Daily at bedtime       Objective:   Physical Exam BP 122/70   Pulse 69   Temp 97.9 F (36.6 C) (Oral)   Resp 16   Ht 6' 2 (1.88 m)   Wt 256 lb 4 oz (116.2 kg)   SpO2 93%   BMI 32.90 kg/m  General:   Well developed, NAD, BMI noted. HEENT:  Normocephalic . Face symmetric, atraumatic Lungs:  CTA B Normal respiratory effort, no intercostal retractions, no accessory muscle use. Heart: RRR,  no murmur.  Lower extremities: no pretibial edema  bilaterally  Skin: Not pale. Not jaundice Neurologic:  alert & oriented X3.  Speech normal, gait assisted by cane Psych--  Cognition and judgment appear intact.  Cooperative with normal attention span and concentration.  Behavior appropriate. No anxious or depressed appearing.      Assessment    ASSESSMENT Diabetes- Dr Tobie  Hyperlipidemia Vitiligo DJD CP: Myoview  09/2022 low risk   PLAN DJD: Bilateral knee DJD, plan for a R TKR November 3.  No cardiovascular symptoms, recent Myoview  test negative. Plan: Cleared for surgery, needs endocrinology clearance as well, stop aspirin 1 week prior to surgery. DM: Per Endo, see above Aspirin: Has not taking aspirin daily, recommend to restart but to stop 1 week prior to surgery Vaccine advice provided RTC 4 months CPX

## 2024-01-20 NOTE — Assessment & Plan Note (Signed)
 DJD: Bilateral knee DJD, plan for a R TKR November 3.  No cardiovascular symptoms, recent Myoview  test negative. Plan: Cleared for surgery, needs endocrinology clearance as well, stop aspirin 1 week prior to surgery. DM: Per Endo, see above Aspirin: Has not taking aspirin daily, recommend to restart but to stop 1 week prior to surgery Vaccine advice provided RTC 4 months CPX

## 2024-03-07 LAB — HEMOGLOBIN A1C: Hemoglobin A1C: 8.5

## 2024-05-08 ENCOUNTER — Encounter: Payer: Self-pay | Admitting: Internal Medicine

## 2024-05-21 ENCOUNTER — Ambulatory Visit: Admitting: Internal Medicine

## 2024-05-21 ENCOUNTER — Encounter: Payer: Self-pay | Admitting: Internal Medicine

## 2024-05-21 VITALS — BP 130/82 | HR 71 | Temp 97.8°F | Resp 18 | Ht 74.0 in | Wt 265.0 lb

## 2024-05-21 DIAGNOSIS — Z0000 Encounter for general adult medical examination without abnormal findings: Secondary | ICD-10-CM | POA: Diagnosis not present

## 2024-05-21 DIAGNOSIS — E109 Type 1 diabetes mellitus without complications: Secondary | ICD-10-CM | POA: Diagnosis not present

## 2024-05-21 DIAGNOSIS — Z23 Encounter for immunization: Secondary | ICD-10-CM | POA: Diagnosis not present

## 2024-05-21 DIAGNOSIS — E7800 Pure hypercholesterolemia, unspecified: Secondary | ICD-10-CM

## 2024-05-21 LAB — LIPID PANEL
Cholesterol: 123 mg/dL (ref 28–200)
HDL: 50.1 mg/dL
LDL Cholesterol: 51 mg/dL (ref 10–99)
NonHDL: 73.05
Total CHOL/HDL Ratio: 2
Triglycerides: 112 mg/dL (ref 10.0–149.0)
VLDL: 22.4 mg/dL (ref 0.0–40.0)

## 2024-05-21 LAB — PSA: PSA: 0.32 ng/mL (ref 0.10–4.00)

## 2024-05-21 NOTE — Assessment & Plan Note (Signed)
 Here for CPX --Td 2020. --PNM 2016. --Flu and PNM 20 today.  Recommend to consider COVID booster. ---CCS: Cscope for hemathochezia 07-2008 no polyps, (+) hemorrhoids, overdue for a colonoscopy.  He is having a knee replacement in 2 weeks, unable to proceed with a colonoscopy, we discussed possibly a stool test but he preferred to reassess the situation when he comes back in 6 months. ---Prostate cancer screening: Check a PSA.  Asymptomatic, no FH ---Labs: From 05/08/2024: Potassium 4.1, creatinine 0.7, alkaline phosphatase 105 minimally elevated.CBC normal. Check a FLP and PSA

## 2024-05-21 NOTE — Progress Notes (Signed)
 "  Subjective:    Patient ID: Daniel [name], male    DOB: 1956/01/04, 68 y.o.   MRN: [id number]  DOS:  05/21/2024 CPX  Discussed the use of AI scribe software for clinical note transcription with the patient, who gave verbal consent to proceed.  History of Present Illness He is a 68 year old male with diabetes who presents for an annual physical exam.  Preoperative status for knee replacement - Scheduled for left knee replacement surgery in two weeks on June 08, 2024 - Surgery previously delayed due to elevated hemoglobin A1c - Currently on disability and has not worked for six months  Glycemic control - Diabetes managed with insulin  under endocrinology care - Most recent hemoglobin A1c was 7.0  Cutaneous findings - Small skin blister noted today - No additional skin lesions or blisters  Cardiovascular risk management - Takes atorvastatin 40 mg daily for hyperlipidemia - Takes daily low-dose aspirin, plans to discontinue prior to surgery  Immunization status - Received two COVID-19 vaccinations - Has not received influenza vaccine this season  Constitutional and systemic symptoms - No chest pain, shortness of breath, nausea, vomiting, diarrhea, blood in stool, or urinary symptoms  Tobacco use - Does not smoke   Review of Systems See above   Past Medical History:  Diagnosis Date   Diabetes mellitus 05/17/07   DJD (degenerative joint disease) of knee    Right knee, end stage, injected 09/2014, Dr. Sheril   Flexor tenosynovitis of finger    Left, Dr. Sheril   Hyperlipidemia    Knee pain    w/u included a MRI of the knee (-) and back, dx w/ Spinal Stenosis (believed to be causing knee pain)   Normal cardiac stress test 09/2007   Shoulder impingement    Right, Dr. Sheril   Vitiligo    dx in the 90s    Past Surgical History:  Procedure Laterality Date   FINGER SURGERY     FOOT ARTHRODESIS, SUBTALAR Right 10/01/2021   SHOULDER SURGERY  09/22/2010    Left, arthroscopy    Current Outpatient Medications  Medication Instructions   aspirin EC 81 mg, Daily   atorvastatin (LIPITOR) 40 MG tablet 1 tablet, Daily   Continuous Blood Gluc Sensor (FREESTYLE LIBRE 14 DAY SENSOR) MISC 1 Device, Does not apply, Every 14 days   HUMALOG  100 UNIT/ML cartridge Insulin  pump   Insulin  Disposable Pump (OMNIPOD 5 G6 INTRO, GEN 5,) KIT Inject into the skin.       Objective:   Physical Exam Skin:       BP 130/82   Pulse 71   Temp 97.8 F (36.6 C) (Oral)   Resp 18   Ht 6' 2 (1.88 m)   Wt 265 lb (120.2 kg)   SpO2 97%   BMI 34.02 kg/m  General: Well developed, NAD, BMI noted Neck: No  thyromegaly  HEENT:  Normocephalic . Face symmetric, atraumatic Lungs:  CTA B Normal respiratory effort, no intercostal retractions, no accessory muscle use. Heart: RRR,  no murmur.  Abdomen:  Not distended, soft, non-tender. No rebound or rigidity.   Lower extremities: no pretibial edema bilaterally  Skin: See graphic Neurologic:  alert & oriented X3.  Speech normal, gait appropriate for age and unassisted Strength symmetric and appropriate for age.  Psych: Cognition and judgment appear intact.  Cooperative with normal attention span and concentration.  Behavior appropriate. No anxious or depressed appearing.     Assessment    ASSESSMENT Diabetes- Dr Tobie  Hyperlipidemia Vitiligo DJD CP: Myoview  09/2022 low risk  Assessment & Plan Here for CPX --Td 2020. --PNM 2016. --Flu and PNM 20 today.  Recommend to consider COVID booster. ---CCS: Cscope for hemathochezia 07-2008 no polyps, (+) hemorrhoids, overdue for a colonoscopy.  He is having a knee replacement in 2 weeks, unable to proceed with a colonoscopy, we discussed possibly a stool test but he preferred to reassess the situation when he comes back in 6 months. ---Prostate cancer screening: Check a PSA.  Asymptomatic, no FH ---Labs: From 05/08/2024: Potassium 4.1, creatinine 0.7, alkaline  phosphatase 105 minimally elevated.CBC normal. Check a FLP and PSA  Other issues DJD Scheduled for knee replacement surgery in two weeks. Surgery delayed due to ACI issues. DM type II: Per Endo, last A1c 7.0. High cholesterol: Managed with atorvastatin 40 mg daily.  Check FLP Blister of skin  likely due to irritation, not shingles.  Doubt shingles.  Allow blister to rupture naturally and apply a bandage once ruptured. RTC 6 months     "

## 2024-05-21 NOTE — Assessment & Plan Note (Signed)
 Here for CPX Other issues DJD Scheduled for knee replacement surgery in two weeks. Surgery delayed due to ACI issues. DM type II: Per Endo, last A1c 7.0. High cholesterol: Managed with atorvastatin 40 mg daily.  Check FLP Blister of skin  likely due to irritation, not shingles.  Doubt shingles.  Allow blister to rupture naturally and apply a bandage once ruptured. RTC 6 months

## 2024-05-21 NOTE — Patient Instructions (Signed)
 GO TO THE LAB :  Get the blood work    Then, go to the front desk for the checkout Please make an appointment for a physical exam in 6 months   Today you got a flu shot and a pneumonia shot.       ADULT WELLNESS VISIT:   -We ordered PSA and cholesterol blood tests. -We will schedule a follow-up visit in six months.  DEGENERATIVE JOINT DISEASE OF KNEE: You are scheduled for knee replacement surgery in two weeks. The surgery was previously delayed due to issues with your hemoglobin A1c levels. -Proceed with your knee replacement surgery as scheduled.  TYPE 2 DIABETES MELLITUS: Your diabetes is being managed by your endocrinologist with insulin  therapy. Your last A1c was 7.0, indicating moderate control. -Continue your current diabetes management with your endocrinologist.  HYPERLIPIDEMIA: Your high cholesterol is being managed with atorvastatin 40 mg daily. -Continue taking atorvastatin 40 mg daily.  BLISTER OF SKIN: You have a small skin blister likely due to irritation. There are no additional blisters or pain. -Allow the blister to rupture naturally and apply a bandage once it has ruptured.

## 2024-05-22 ENCOUNTER — Ambulatory Visit: Payer: Self-pay | Admitting: Internal Medicine

## 2024-11-20 ENCOUNTER — Ambulatory Visit: Admitting: Internal Medicine
